# Patient Record
Sex: Female | Born: 1990 | Race: Black or African American | Hispanic: No | Marital: Single | Smoking: Current every day smoker
Health system: Southern US, Community
[De-identification: ages and names within clinical notes are randomized; demographics above are authoritative.]

## PROBLEM LIST (undated history)

## (undated) ENCOUNTER — Inpatient Hospital Stay (HOSPITAL_COMMUNITY): Payer: Self-pay

## (undated) DIAGNOSIS — D649 Anemia, unspecified: Secondary | ICD-10-CM

## (undated) DIAGNOSIS — F32A Depression, unspecified: Secondary | ICD-10-CM

## (undated) DIAGNOSIS — Z789 Other specified health status: Secondary | ICD-10-CM

## (undated) DIAGNOSIS — F329 Major depressive disorder, single episode, unspecified: Secondary | ICD-10-CM

## (undated) DIAGNOSIS — R51 Headache: Secondary | ICD-10-CM

## (undated) DIAGNOSIS — R87629 Unspecified abnormal cytological findings in specimens from vagina: Secondary | ICD-10-CM

## (undated) DIAGNOSIS — J45909 Unspecified asthma, uncomplicated: Secondary | ICD-10-CM

## (undated) DIAGNOSIS — J302 Other seasonal allergic rhinitis: Secondary | ICD-10-CM

## (undated) DIAGNOSIS — R519 Headache, unspecified: Secondary | ICD-10-CM

## (undated) DIAGNOSIS — F419 Anxiety disorder, unspecified: Secondary | ICD-10-CM

## (undated) HISTORY — PX: COLPOSCOPY: SHX161

## (undated) HISTORY — DX: Unspecified asthma, uncomplicated: J45.909

## (undated) HISTORY — PX: THERAPEUTIC ABORTION: SHX798

---

## 2015-09-21 NOTE — L&D Delivery Note (Signed)
Delivery Note At 4:49 PM a viable and healthy female was delivered via Vaginal, Spontaneous Delivery (Presentation:vtx ;LOA  ).  APGAR: 9, 9; weight 7 lb 0.2 oz (3181 g).   Placenta status: spontaneous, intact not sent , .  Cord:  with the following complications: none.  Cord pH: none  Anesthesia:  epidural Episiotomy: None Lacerations: None Suture Repair: n/a Est. Blood Loss (mL): 250  Mom to postpartum.  Baby to Couplet care / Skin to Skin.  Thresia Ramanathan A 09/04/2016, 6:30 PM

## 2015-10-29 ENCOUNTER — Ambulatory Visit (INDEPENDENT_AMBULATORY_CARE_PROVIDER_SITE_OTHER): Payer: Self-pay

## 2015-10-29 DIAGNOSIS — Z3201 Encounter for pregnancy test, result positive: Secondary | ICD-10-CM

## 2015-10-29 LAB — POCT PREGNANCY, URINE: PREG TEST UR: POSITIVE — AB

## 2015-10-29 NOTE — Progress Notes (Signed)
Pt here today for pregnancy test.  Resulted positive.  Pt decided to come to the Clinics for her prenatal care.  Because pt's BMI is >30 she will need a early one hour.  Due to time, pt decided to wait to do all lab work until her NEW OB appt.  LMP is 10/07/15 pt reports.  EDD 07/03/16.

## 2015-11-17 ENCOUNTER — Telehealth: Payer: Self-pay | Admitting: General Practice

## 2015-11-17 NOTE — Telephone Encounter (Signed)
Patient called and left message stating she was here on 2/8 and had a positive pregnancy test. Patient states she went to the ER yesterday for spotting and was told that she was only 2-[redacted] weeks pregnant and was possible having a miscarriage. Patient is calling us for advice on what she needs to do from here. Called patient and she states she has only had spotting and just sees something when she wipes. Patient states she has been cramping some. Patient states they did an ultrasound and told her they couldn't see anything and from her blood test she was only 2-[redacted] weeks pregnant. Patient states she went to [facility] ER and had sex over the weekend which is when the bleeding started. Told patient that it can be normal to spot especially after intercourse. Told patient that the blood test cannot determine how far along she is. Told patient the blood test is only helpful in observing the trend of the numbers and if they are going up or down or doubling every 48 hours like we would expect. Told patient if she isn't far enough along the ultrasound will not show a pregnancy either. Discussed with patient if she has increased bleeding or increased pain to let us know otherwise we will follow up with her at her appt per Dr Jolayne Panther. Told patient if she has any other problems or questions and our office is closed, to go to the ER here at [facility] rather than [facility]. Patient verbalized understanding to all & had no other questions

## 2015-11-17 NOTE — Telephone Encounter (Signed)
Patient called into front office stating she doesn't want to wait until the end of the month to find out if she has miscarried or not. Spoke to Dr Adrian Blackwater who recommended patient come tomorrow for bhcg and to have patient bring results from [facility] with her if not patient should return on Thursday for repeat bhcg. Discussed recommendations with patient. Patient states she can come tomorrow at 830am. Patient had no questions

## 2015-11-18 ENCOUNTER — Inpatient Hospital Stay (HOSPITAL_COMMUNITY)
Admission: AD | Admit: 2015-11-18 | Discharge: 2015-11-18 | Disposition: A | Discharge status: Home / Self Care | Payer: 59 | Source: Ambulatory Visit | Attending: Obstetrics & Gynecology | Admitting: Obstetrics & Gynecology

## 2015-11-18 ENCOUNTER — Encounter (HOSPITAL_COMMUNITY): Payer: Self-pay | Admitting: *Deleted

## 2015-11-18 ENCOUNTER — Other Ambulatory Visit: Payer: 59

## 2015-11-18 ENCOUNTER — Inpatient Hospital Stay (HOSPITAL_COMMUNITY): Payer: 59

## 2015-11-18 DIAGNOSIS — Z3A01 Less than 8 weeks gestation of pregnancy: Secondary | ICD-10-CM | POA: Diagnosis not present

## 2015-11-18 DIAGNOSIS — R109 Unspecified abdominal pain: Secondary | ICD-10-CM

## 2015-11-18 DIAGNOSIS — O26891 Other specified pregnancy related conditions, first trimester: Secondary | ICD-10-CM | POA: Insufficient documentation

## 2015-11-18 DIAGNOSIS — N939 Abnormal uterine and vaginal bleeding, unspecified: Secondary | ICD-10-CM | POA: Diagnosis present

## 2015-11-18 DIAGNOSIS — O26899 Other specified pregnancy related conditions, unspecified trimester: Secondary | ICD-10-CM

## 2015-11-18 DIAGNOSIS — O200 Threatened abortion: Secondary | ICD-10-CM | POA: Diagnosis not present

## 2015-11-18 HISTORY — DX: Other specified health status: Z78.9

## 2015-11-18 LAB — URINALYSIS, ROUTINE W REFLEX MICROSCOPIC
Bilirubin Urine: NEGATIVE
Glucose, UA: NEGATIVE mg/dL
Ketones, ur: 15 mg/dL — AB
LEUKOCYTES UA: NEGATIVE
NITRITE: NEGATIVE
PH: 6.5 (ref 5.0–8.0)
Protein, ur: NEGATIVE mg/dL
SPECIFIC GRAVITY, URINE: 1.02 (ref 1.005–1.030)

## 2015-11-18 LAB — CBC
HCT: 38.5 % (ref 36.0–46.0)
HEMOGLOBIN: 12.8 g/dL (ref 12.0–15.0)
MCH: 31.1 pg (ref 26.0–34.0)
MCHC: 33.2 g/dL (ref 30.0–36.0)
MCV: 93.4 fL (ref 78.0–100.0)
Platelets: 386 10*3/uL (ref 150–400)
RBC: 4.12 MIL/uL (ref 3.87–5.11)
RDW: 13.1 % (ref 11.5–15.5)
WBC: 6.8 10*3/uL (ref 4.0–10.5)

## 2015-11-18 LAB — URINE MICROSCOPIC-ADD ON

## 2015-11-18 LAB — HCG, QUANTITATIVE, PREGNANCY: hCG, Beta Chain, Quant, S: 457 m[IU]/mL — ABNORMAL HIGH (ref <5)

## 2015-11-18 MED ORDER — HYDROMORPHONE HCL 1 MG/ML IJ SOLN
1.0000 mg | Freq: Once | INTRAMUSCULAR | Status: AC
  Administered 2015-11-18: 1 mg via INTRAMUSCULAR
  Filled 2015-11-18: qty 1

## 2015-11-18 MED ORDER — PROMETHAZINE HCL 25 MG/ML IJ SOLN
12.5000 mg | Freq: Once | INTRAMUSCULAR | Status: AC
  Administered 2015-11-18: 12.5 mg via INTRAMUSCULAR
  Filled 2015-11-18: qty 1

## 2015-11-18 NOTE — Progress Notes (Unsigned)
Pt came to clinic to have hcg drawn. She states that she is in severe pain and is bleeding heavy like a period. Patient escorted to MAU for evaluation.

## 2015-11-18 NOTE — MAU Note (Signed)
Pt states she had intercourse Sunday morning started spotting afterwards.  Went to [facility] that day.  They did ultrasound and said she was about 2-[redacted] weeks pregnant.  Pt continues to bleed more heavy which started yesterday.  Passing dime sized clots.  Cramping like a period.  Pt had vaginal cultures [place] on Sunday.  Awaiting on STD results.

## 2015-11-18 NOTE — Discharge Instructions (Signed)

## 2015-11-18 NOTE — MAU Provider Note (Signed)
Chief Complaint: Abdominal Pain and Vaginal Bleeding   First Provider Initiated Contact with Patient 11/18/15 0932        SUBJECTIVE   Beth Carey is a 25 y.o. G4P1021 at [redacted]w[redacted]d by LMP who presents to maternity admissions reporting increased bleeding and cramping  Was seen at [facility] ED 2 days ago but decided to follow up here because she didn't like that hospital. She denies vaginal itching/burning, urinary symptoms, h/a, dizziness, n/v, or fever/chills.    Abdominal Pain This is a recurrent problem. The current episode started in the past 7 days. The onset quality is gradual. The problem occurs constantly. The problem has been unchanged. The pain is located in the LLQ, RLQ and suprapubic region. The quality of the pain is cramping. The abdominal pain does not radiate. Pertinent negatives include no constipation, diarrhea, dysuria, fever, headaches, myalgias, nausea or vomiting. Nothing aggravates the pain. The pain is relieved by nothing. She has tried nothing for the symptoms.  Vaginal Bleeding The patient's primary symptoms include pelvic pain. The patient's pertinent negatives include no vaginal discharge. Associated symptoms include abdominal pain. Pertinent negatives include no back pain, chills, constipation, diarrhea, dysuria, fever, headaches, nausea or vomiting.    Past Medical History  Diagnosis Date  . Medical history non-contributory    Past Surgical History  Procedure Laterality Date  . No past surgeries     Social History   Social History  . Marital Status: Single    Spouse Name: N/A  . Number of Children: N/A  . Years of Education: N/A   Occupational History  . Not on file.   Social History Main Topics  . Smoking status: Never Smoker   . Smokeless tobacco: Not on file  . Alcohol Use: No  . Drug Use: No  . Sexual Activity: Yes    Birth Control/ Protection: None   Other Topics Concern  . Not on file   Social History Narrative  . No narrative on file    No current facility-administered medications on file prior to encounter.   No current outpatient prescriptions on file prior to encounter.   No Known Allergies  I have reviewed patient's Past Medical Hx, Surgical Hx, Family Hx, Social Hx, medications and allergies.   ROS:  Review of Systems  Constitutional: Negative for fever, chills and fatigue.  Gastrointestinal: Positive for abdominal pain. Negative for nausea, vomiting, diarrhea and constipation.  Genitourinary: Positive for vaginal bleeding and pelvic pain. Negative for dysuria and vaginal discharge.  Musculoskeletal: Negative for myalgias and back pain.  Neurological: Negative for headaches.  Other systems negative  Physical Exam  Patient Vitals for the past 24 hrs:  BP Temp Temp src Pulse Resp SpO2  11/18/15 0937 116/67 mmHg 98.8 F (37.1 C) Oral 113 16 100 %    Physical Exam  Constitutional: Well-developed, well-nourished female in no acute distress.  Cardiovascular: normal rate Respiratory: normal effort GI: Abd soft, non-tender. Pos BS x 4    MS: Extremities nontender, no edema, normal ROM Neurologic: Alert and oriented x 4.  GU: Neg CVAT.  PELVIC EXAM: Uterus nonenlarged, <6 wks size, mildly  tender to palpation                           EGBUS WNL, pad dry.                             Scant bleeding  despite patient's report of blood "flowing" LAB RESULTS Results for orders placed or performed during the hospital encounter of 11/18/15 (from the past 24 hour(s))  Urinalysis, Routine w reflex microscopic (not at [facility])     Status: Abnormal   Collection Time: 11/18/15  9:15 AM  Result Value Ref Range   Color, Urine YELLOW YELLOW   APPearance CLEAR CLEAR   Specific Gravity, Urine 1.020 1.005 - 1.030   pH 6.5 5.0 - 8.0   Glucose, UA NEGATIVE NEGATIVE mg/dL   Hgb urine dipstick LARGE (A) NEGATIVE   Bilirubin Urine NEGATIVE NEGATIVE   Ketones, ur 15 (A) NEGATIVE mg/dL   Protein, ur NEGATIVE NEGATIVE mg/dL    Nitrite NEGATIVE NEGATIVE   Leukocytes, UA NEGATIVE NEGATIVE  Urine microscopic-add on     Status: Abnormal   Collection Time: 11/18/15  9:15 AM  Result Value Ref Range   Squamous Epithelial / LPF 0-5 (A) NONE SEEN   WBC, UA 0-5 0 - 5 WBC/hpf   RBC / HPF 6-30 0 - 5 RBC/hpf   Bacteria, UA FEW (A) NONE SEEN   Urine-Other MUCOUS PRESENT   CBC     Status: None   Collection Time: 11/18/15  9:46 AM  Result Value Ref Range   WBC 6.8 4.0 - 10.5 K/uL   RBC 4.12 3.87 - 5.11 MIL/uL   Hemoglobin 12.8 12.0 - 15.0 g/dL   HCT 16.1 09.6 - 04.5 %   MCV 93.4 78.0 - 100.0 fL   MCH 31.1 26.0 - 34.0 pg   MCHC 33.2 30.0 - 36.0 g/dL   RDW 40.9 81.1 - 91.4 %   Platelets 386 150 - 400 K/uL  hCG, quantitative, pregnancy     Status: Abnormal   Collection Time: 11/18/15  9:48 AM  Result Value Ref Range   hCG, Beta Chain, Quant, S 457 (H) <5 mIU/mL   Prior Quant HCG level was 424 on 11/16/15    IMAGING US Ob Comp Less 14 Wks  11/18/2015  CLINICAL DATA:  Two-day history of vaginal bleeding with low abdominal pain and positive pregnancy test. EXAM: OBSTETRIC <14 WK [place] AND TRANSVAGINAL OB US TECHNIQUE: Both transabdominal and transvaginal ultrasound examinations were performed for complete evaluation of the gestation as well as the maternal uterus, adnexal regions, and pelvic cul-de-sac. Transvaginal technique was performed to assess early pregnancy. COMPARISON:  11/16/2015 FINDINGS: Intrauterine gestational sac: Not visualized. Yolk sac:  Not present. Embryo:  Not present. Cardiac Activity: Not press. Subchorionic hemorrhage:  None visualized. Maternal uterus/adnexae: Endometrium is slightly thickened and heterogeneous. Maternal ovaries are unremarkable. No evidence for adnexal mass. IMPRESSION: No intrauterine gestation identified and no adnexal masses evident. Given the history of a positive pregnancy test, differential considerations include intrauterine gestation too early to visualize, completed abortion, or  nonvisualized ectopic pregnancy. Close clinical correlation is recommended with serial beta-hCG and followup ultrasound as warranted. Electronically Signed   By: Kennith Center M.D.   On: 11/18/2015 11:58   US Ob Transvaginal  11/18/2015  CLINICAL DATA:  Two-day history of vaginal bleeding with low abdominal pain and positive pregnancy test. EXAM: OBSTETRIC <14 WK [place] AND TRANSVAGINAL OB US TECHNIQUE: Both transabdominal and transvaginal ultrasound examinations were performed for complete evaluation of the gestation as well as the maternal uterus, adnexal regions, and pelvic cul-de-sac. Transvaginal technique was performed to assess early pregnancy. COMPARISON:  11/16/2015 FINDINGS: Intrauterine gestational sac: Not visualized. Yolk sac:  Not present. Embryo:  Not present. Cardiac Activity: Not press. Subchorionic hemorrhage:  None visualized. Maternal uterus/adnexae: Endometrium is slightly thickened and heterogeneous. Maternal ovaries are unremarkable. No evidence for adnexal mass. IMPRESSION: No intrauterine gestation identified and no adnexal masses evident. Given the history of a positive pregnancy test, differential considerations include intrauterine gestation too early to visualize, completed abortion, or nonvisualized ectopic pregnancy. Close clinical correlation is recommended with serial beta-hCG and followup ultrasound as warranted. Electronically Signed   By: Kennith Center M.D.   On: 11/18/2015 11:58     MAU Management/MDM: Ordered labs and reviewed results.   Consult Dr Penne Lash with presentation, exam findings, and results.  She recommends getting another [place] to rule out ectopic location of pregnancy Treatments in MAU included Dilaudid and Phenergan injection for pain and nausea.   This bleeding could represent a normal pregnancy with bleeding, spontaneous abortion or even an ectopic which can be life-threatening.   Cultures were done to rule out pelvic infection at [facility]. Blood drawn for  Quant HCG, CBC.   ABO/Rh done at [facility] showed type of O+  ASSESSMENT Pregnancy at [redacted]w[redacted]d  Pregnancy of unknown viability and location  PLAN Dr Penne Lash discussed results with patient and offered F/U Quant HCG in 2 days vs Methotrexate for possible ectopic  Patient wants to do one more HCG level Discharge home Strict ectopic precautions Pt stable at time of discharge. Appt made for Thursday in clinic at 11am for HCG     Medication List    Notice    You have not been prescribed any medications.       Encouraged to return here or to other Urgent Care/ED if she develops worsening of symptoms, increase in pain, fever, or other concerning symptoms.    Wynelle Bourgeois CNM, MSN Certified Nurse-Midwife 11/18/2015  10:16 AM

## 2015-11-20 ENCOUNTER — Encounter: Payer: Self-pay | Admitting: Advanced Practice Midwife

## 2015-11-20 ENCOUNTER — Ambulatory Visit (INDEPENDENT_AMBULATORY_CARE_PROVIDER_SITE_OTHER): Payer: 59 | Admitting: Advanced Practice Midwife

## 2015-11-20 DIAGNOSIS — O034 Incomplete spontaneous abortion without complication: Secondary | ICD-10-CM | POA: Insufficient documentation

## 2015-11-20 DIAGNOSIS — O3680X0 Pregnancy with inconclusive fetal viability, not applicable or unspecified: Secondary | ICD-10-CM

## 2015-11-20 LAB — HCG, QUANTITATIVE, PREGNANCY: hCG, Beta Chain, Quant, S: 133 m[IU]/mL — ABNORMAL HIGH (ref <5)

## 2015-11-20 NOTE — Patient Instructions (Signed)
Miscarriage  A miscarriage is the sudden loss of an unborn baby (fetus) before the 20th week of pregnancy. Most miscarriages happen in the first 3 months of pregnancy. Sometimes, it happens before a woman even knows she is pregnant. A miscarriage is also called a "spontaneous miscarriage" or "early pregnancy loss." Having a miscarriage can be an emotional experience. Talk with your caregiver about any questions you may have about miscarrying, the grieving process, and your future pregnancy plans.  CAUSES    Problems with the fetal chromosomes that make it impossible for the baby to develop normally. Problems with the baby's genes or chromosomes are most often the result of errors that occur, by chance, as the embryo divides and grows. The problems are not inherited from the parents.   Infection of the cervix or uterus.    Hormone problems.    Problems with the cervix, such as having an incompetent cervix. This is when the tissue in the cervix is not strong enough to hold the pregnancy.    Problems with the uterus, such as an abnormally shaped uterus, uterine fibroids, or congenital abnormalities.    Certain medical conditions.    Smoking, drinking alcohol, or taking illegal drugs.    Trauma.   Often, the cause of a miscarriage is unknown.   SYMPTOMS    Vaginal bleeding or spotting, with or without cramps or pain.   Pain or cramping in the abdomen or lower back.   Passing fluid, tissue, or blood clots from the vagina.  DIAGNOSIS   Your caregiver will perform a physical exam. You may also have an ultrasound to confirm the miscarriage. Blood or urine tests may also be ordered.  TREATMENT    Sometimes, treatment is not necessary if you naturally pass all the fetal tissue that was in the uterus. If some of the fetus or placenta remains in the body (incomplete miscarriage), tissue left behind may become infected and must be removed. Usually, a dilation and curettage (D and C) procedure is performed.  During a D and C procedure, the cervix is widened (dilated) and any remaining fetal or placental tissue is gently removed from the uterus.   Antibiotic medicines are prescribed if there is an infection. Other medicines may be given to reduce the size of the uterus (contract) if there is a lot of bleeding.   If you have Rh negative blood and your baby was Rh positive, you will need a Rh immunoglobulin shot. This shot will protect any future baby from having Rh blood problems in future pregnancies.  HOME CARE INSTRUCTIONS    Your caregiver may order bed rest or may allow you to continue light activity. Resume activity as directed by your caregiver.   Have someone help with home and family responsibilities during this time.    Keep track of the number of sanitary pads you use each day and how soaked (saturated) they are. Write down this information.    Do not use tampons. Do not douche or have sexual intercourse until approved by your caregiver.    Only take over-the-counter or prescription medicines for pain or discomfort as directed by your caregiver.    Do not take aspirin. Aspirin can cause bleeding.    Keep all follow-up appointments with your caregiver.    If you or your partner have problems with grieving, talk to your caregiver or seek counseling to help cope with the pregnancy loss. Allow enough time to grieve before trying to get pregnant again.     SEEK IMMEDIATE MEDICAL CARE IF:    You have severe cramps or pain in your back or abdomen.   You have a fever.   You pass large blood clots (walnut-sized or larger) ortissue from your vagina. Save any tissue for your caregiver to inspect.    Your bleeding increases.    You have a thick, bad-smelling vaginal discharge.   You become lightheaded, weak, or you faint.    You have chills.   MAKE SURE YOU:   Understand these instructions.   Will watch your condition.   Will get help right away if you are not doing well or get worse.     This  information is not intended to replace advice given to you by your health care provider. Make sure you discuss any questions you have with your health care provider.     Document Released: 03/02/2001 Document Revised: 01/01/2013 Document Reviewed: 10/26/2011  Elsevier Interactive Patient Education 2016 [employer] Inc.

## 2015-11-20 NOTE — Progress Notes (Signed)
[place] her for stat bhcg. Denies severe pain, states still having bleeding same as 2 days ago- with clots , changing pad every 2 hours.

## 2015-11-20 NOTE — Progress Notes (Signed)
Beth Carey  is a 25 y.o. [phone] at [redacted]w[redacted]d who presents to the [facility] today for follow-up quant hCG after 48 hours. The patient was seen in ED on 11/16/15 and had quant hCG of 424 and US showed nothing in uterus. She endorses mild pain, light to moderate vaginal bleeding but no fever today. She was seen by me and Dr Penne Lash 2 days ago. Her HCG was about the same as it was the first day.  She was counseled toward possibility of SAB or ectopic but wanted to do one more quant as this is a desired pregnancy.   Ref. Range 11/18/2015 09:48 11/18/2015 11:48 11/20/2015 11:25  HCG, Beta Chain, Quant, S Latest Ref Range: <5 mIU/mL 457 (H)  133 (H)   OB History  Gravida Para Term Preterm AB SAB TAB Ectopic Multiple Living  # Outcome Date GA Lbr Len/2nd Weight Sex Delivery Anes PTL Lv  4 Current           3 AB           2 AB           1 Term               Past Medical History  Diagnosis Date  . Medical history non-contributory      LMP 09/27/2015  CONSTITUTIONAL: Well-developed, well-nourished female in no acute distress.  ENT: External right and left ear normal.  EYES: EOM intact, conjunctivae normal.  MUSCULOSKELETAL: Normal range of motion.  CARDIOVASCULAR: Regular heart rate RESPIRATORY: Normal effort NEUROLOGICAL: Alert and oriented to person, place, and time.  SKIN: Skin is warm and dry. No rash noted. Not diaphoretic. No erythema. No pallor. PSYCH: Normal mood and affect. Normal behavior. Normal judgment and thought content. Pelvic exam deferred  Results for orders placed or performed in visit on 11/20/15 (from the past 24 hour(s))  B-HCG Quant     Status: Abnormal   Collection Time: 11/20/15 11:25 AM  Result Value Ref Range   hCG, Beta Chain, Quant, S 133 (H) <5 mIU/mL    A: Pregnancy at [redacted]w[redacted]d by LMP Fall in quant hCG after 48 hours  P: Discharge home Discussed SAB. Very tearful. Long discussion of coping and plan of care. Repeat HCG in  one week. No sex until she stops bleeding.  Memory heart given to patient Patient may return to MAU as needed or if her condition were to change or worsen   Aviva Signs, CNM 11/20/2015 2:30 PM

## 2015-11-27 ENCOUNTER — Other Ambulatory Visit: Payer: 59

## 2015-11-27 DIAGNOSIS — O3680X0 Pregnancy with inconclusive fetal viability, not applicable or unspecified: Secondary | ICD-10-CM

## 2015-11-27 LAB — HCG, QUANTITATIVE, PREGNANCY

## 2015-12-18 ENCOUNTER — Encounter: Payer: Self-pay | Admitting: Obstetrics and Gynecology

## 2016-01-30 ENCOUNTER — Ambulatory Visit (INDEPENDENT_AMBULATORY_CARE_PROVIDER_SITE_OTHER): Admitting: Emergency Medicine

## 2016-01-30 VITALS — BP 102/70 | HR 79 | Temp 98.5°F | Resp 16 | Ht 64.75 in | Wt 201.2 lb

## 2016-01-30 DIAGNOSIS — S7001XA Contusion of right hip, initial encounter: Secondary | ICD-10-CM | POA: Diagnosis not present

## 2016-01-30 DIAGNOSIS — S7011XA Contusion of right thigh, initial encounter: Secondary | ICD-10-CM

## 2016-01-30 DIAGNOSIS — Z3490 Encounter for supervision of normal pregnancy, unspecified, unspecified trimester: Secondary | ICD-10-CM

## 2016-01-30 DIAGNOSIS — Z331 Pregnant state, incidental: Secondary | ICD-10-CM | POA: Insufficient documentation

## 2016-01-30 NOTE — Progress Notes (Signed)
   Subjective:  This chart was scribed for Lesle ChrisSteven Neila Teem MD,  by Veverly FellsHatice Demirci,scribe, at [facility].  This patient was seen in room 13 and the patient's care was started at 12:16 PM.   Chief Complaint  Patient presents with  . Fall    fell 01/27/16 at work and landed on right thigh and leg   . OTHER    PATIENT IS PREGNANT; NO XRAYS--8 WEEKS     Patient ID: Beth Carey, female    DOB: 1991-08-17, 25 y.o.   MRN: [id number]  HPI HPI Comments: Beth Carey is a 25 y.o. female who presents to the [facility] complaining of a thigh injury which occurred when she was walking down a wood ramp and fell on her right side (hip/leg) three days ago.  She continued working that day but had not been able to go back since. 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  She is currently pregnant (8 weeks).  She denies any concerns regarding her pregnancy and is going to  [facility].  This will be her second child.  First child is 27 years old. She has no other questions or concerns today. Patient is willing to come back Wednesday for a follow up.   Review of Systems  Constitutional: Negative for fever and chills.  Eyes: Negative for pain, redness and itching.  Respiratory: Negative for cough, choking and shortness of breath.   Gastrointestinal: Negative for nausea and vomiting.  Musculoskeletal: Positive for myalgias. Negative for neck pain and neck stiffness.  Skin: Negative for rash.  Neurological: Negative for seizures, syncope and speech difficulty.       Objective:   Physical Exam Filed Vitals:   01/30/16 1110  BP: 102/70  Pulse: 79  Temp: 98.5 F (36.9 C)  TempSrc: Oral  Resp: 16  Height: 5' 4.75" (1.645 m)  Weight: 201 lb 3.2 oz (91.264 kg)  SpO2: 100%    CONSTITUTIONAL: Well developed/well nourished, pregnant HEAD: Normocephalic/atraumatic EYES: EOMI/PERRL SPINE/BACK:entire spine nontender CV:  S1/S2 noted, no murmurs/rubs/gallops noted LUNGS: Lungs are clear to auscultation bilaterally, no apparent distress NEURO: Pt is awake/alert/appropriate, moves all extremitiesx4.  No facial droop.   EXTREMITIES:Mild tenderness upper right thigh with pain on full flexion of the right leg. No bruising or swelling and good range of motion of the hip.  SKIN: warm, color normal PSYCH: no abnormalities of mood noted, alert and oriented to situation      Assessment & Plan:  Patient will be on light duty for 5 days. We'll recheck on Wednesday. She will be limited walking no kneeling  Or squatting. I personally performed the services described in this documentation, which was scribed in my presence. The recorded information has been reviewed and is accurate. Collene GobbleSteven A Misako Roeder, MD

## 2016-01-30 NOTE — Patient Instructions (Addendum)
please check with your OB/GYN regarding what medications you can take. If he gets the okay you can take 1 extra strength Tylenol every 6 hours. You can also try to apply heat to alternate with  Ice packs to the area.    IF you received an x-ray today, you will receive an invoice from [facility]. Please contact [facility] at [phone] with questions or concerns regarding your invoice.   IF you received labwork today, you will receive an invoice from [facility]. Please contact [facility] at [phone] with questions or concerns regarding your invoice.   Our billing staff will not be able to assist you with questions regarding bills from these companies.  You will be contacted with the lab results as soon as they are available. The fastest way to get your results is to activate your My Chart account. Instructions are located on the last page of this paperwork. If you have not heard from us regarding the results in 2 weeks, please contact this office.

## 2016-02-05 ENCOUNTER — Ambulatory Visit (INDEPENDENT_AMBULATORY_CARE_PROVIDER_SITE_OTHER): Admitting: Family Medicine

## 2016-02-05 VITALS — BP 102/68 | HR 83 | Temp 98.5°F | Resp 16 | Ht 64.0 in | Wt 201.8 lb

## 2016-02-05 DIAGNOSIS — S7001XD Contusion of right hip, subsequent encounter: Secondary | ICD-10-CM | POA: Diagnosis not present

## 2016-02-05 NOTE — Progress Notes (Signed)
[age] yo woman who injured her right thigh when she fell on right side while walking down a wooden ramp.  Date of injury:  01/27/16 Initial evaluation:  01/30/16  Patient is not having any residual symptoms at this point. She's been walking without difficulty.  Objective:BP 102/68 mmHg  Pulse 83  Temp(Src) 98.5 F (36.9 C) (Oral)  Resp 16  Ht 5\' 4"  (1.626 m)  Wt 201 lb 12.8 oz (91.536 kg)  BMI 34.62 kg/m2  SpO2 99%  LMP 09/27/2015 Hip range of motion is normal on the right. Gait: Normal Palpation of the hip: No pain Inspection of the hip area: No ecchymosis or swelling  Assessment: Resolved contusion of the right hip  Signed, Sheila OatsKurt Leslea Vowles M.D.

## 2016-02-11 LAB — OB RESULTS CONSOLE ABO/RH: RH TYPE: POSITIVE

## 2016-02-11 LAB — OB RESULTS CONSOLE RPR: RPR: NONREACTIVE

## 2016-02-11 LAB — OB RESULTS CONSOLE GC/CHLAMYDIA
CHLAMYDIA, DNA PROBE: NEGATIVE
GC PROBE AMP, GENITAL: NEGATIVE

## 2016-02-11 LAB — OB RESULTS CONSOLE HIV ANTIBODY (ROUTINE TESTING): HIV: NONREACTIVE

## 2016-02-11 LAB — OB RESULTS CONSOLE RUBELLA ANTIBODY, IGM: Rubella: IMMUNE

## 2016-02-11 LAB — OB RESULTS CONSOLE ANTIBODY SCREEN: Antibody Screen: NEGATIVE

## 2016-02-11 LAB — OB RESULTS CONSOLE HEPATITIS B SURFACE ANTIGEN: HEP B S AG: NEGATIVE

## 2016-03-26 ENCOUNTER — Ambulatory Visit (INDEPENDENT_AMBULATORY_CARE_PROVIDER_SITE_OTHER): Admitting: Physician Assistant

## 2016-03-26 VITALS — BP 100/70 | HR 98 | Temp 98.8°F | Resp 16 | Ht 65.5 in | Wt 208.8 lb

## 2016-03-26 DIAGNOSIS — W540XXA Bitten by dog, initial encounter: Secondary | ICD-10-CM

## 2016-03-26 DIAGNOSIS — S71131A Puncture wound without foreign body, right thigh, initial encounter: Secondary | ICD-10-CM

## 2016-03-26 MED ORDER — MUPIROCIN 2 % EX OINT: 1.0000 "application " | TOPICAL_OINTMENT | Freq: Three times a day (TID) | CUTANEOUS | Status: DC

## 2016-03-26 NOTE — Patient Instructions (Signed)
WOUND CARE . Clean the wound twice daily for three days and reapply the The ointment and bandaids. . Continue daily cleansing with soap and water until stitches/staples are removed. . Do not apply any ointments or creams to the wound while stitches/staples are in place, as this may cause delayed healing. . Notify the office if you experience any of the following signs of infection: Swelling, redness, pus drainage, streaking, fever >101.0 F . Notify the office if you experience excessive bleeding that does not stop after 15-20 minutes of constant, firm pressure.

## 2016-03-27 NOTE — Progress Notes (Signed)
   03/28/2016 7:53 PM   DOB: 1991-02-16 / MRN: [id number]  SUBJECTIVE:  Beth Carey is a 25 y.o. female presenting for a dog bite sustained while at work.  Reports the dog was "in someone's yard" and was wearing a collar.  She reports she is [redacted] weeks pregnant. She denies weakness, paresthesia.  Denies exquisite tenderness at the site of the wound.      [facility] 03/26/2016 02/05/2016 01/30/2016  Decreased Interest 1 0 0  Down, Depressed, Hopeless 1 0 0  PHQ - 2 Score 2 0 0  Altered sleeping 1 - -  Tired, decreased energy 1 - -  Change in appetite 0 - -  Feeling bad or failure about yourself  0 - -  Trouble concentrating 0 - -  Moving slowly or fidgety/restless 0 - -  Suicidal thoughts 0 - -  PHQ-9 Score 4 - -  Difficult doing work/chores Somewhat difficult - -      She has no known allergies.   Review of Systems  Constitutional: Negative for fever and chills.  Cardiovascular: Negative for chest pain.  Gastrointestinal: Negative for nausea.  Skin: Negative for itching and rash.  Neurological: Negative for headaches.    Problem list and medications reviewed and updated by myself where necessary, and exist elsewhere in the encounter.   OBJECTIVE:  BP 100/70 mmHg  Pulse 98  Temp(Src) 98.8 F (37.1 C) (Oral)  Resp 16  Ht 5' 5.5" (1.664 m)  Wt 208 lb 12.8 oz (94.711 kg)  BMI 34.21 kg/m2  SpO2 98%  Physical Exam  Constitutional: She is oriented to person, place, and time. She appears well-nourished. No distress.  Eyes: EOM are normal. Pupils are equal, round, and reactive to light.  Cardiovascular: Normal rate.   Pulmonary/Chest: Effort normal.  Abdominal: She exhibits no distension.  Neurological: She is alert and oriented to person, place, and time. No cranial nerve deficit. Gait normal.  Skin: Skin is dry. She is not diaphoretic.  Psychiatric: She has a normal mood and affect.  Vitals reviewed.       No results found.  ASSESSMENT AND  PLAN  Beth Carey was seen today for dog bite, pregnant and depression scale during triage.  Diagnoses and all orders for this visit:  Dog bite  Puncture wound of thigh, right, initial encounter: Her wound is shallow and she was bitten by a house dog.  Advised mupirocin.  If worse tomorrow call or RTC so I can prescribe keflex.  Back to work on in 2 days if all goes well.   -     mupirocin ointment (BACTROBAN) 2 %; Apply 1 application topically 3 (three) times daily.    The patient was advised to call or return to clinic if she does not see an improvement in symptoms, or to seek the care of the closest emergency department if she worsens with the above plan.   Deliah BostonMichael Clark, MHS, PA-C [facility] 03/28/2016 7:53 PM

## 2016-07-28 ENCOUNTER — Encounter (HOSPITAL_COMMUNITY): Payer: Self-pay | Admitting: *Deleted

## 2016-07-28 ENCOUNTER — Inpatient Hospital Stay (HOSPITAL_COMMUNITY): Payer: 59

## 2016-07-28 ENCOUNTER — Inpatient Hospital Stay (HOSPITAL_COMMUNITY)
Admission: AD | Admit: 2016-07-28 | Discharge: 2016-07-28 | Disposition: A | Discharge status: Home / Self Care | Payer: 59 | Source: Ambulatory Visit | Attending: Obstetrics and Gynecology | Admitting: Obstetrics and Gynecology

## 2016-07-28 ENCOUNTER — Other Ambulatory Visit: Payer: Self-pay | Admitting: Obstetrics and Gynecology

## 2016-07-28 DIAGNOSIS — O99213 Obesity complicating pregnancy, third trimester: Secondary | ICD-10-CM | POA: Insufficient documentation

## 2016-07-28 DIAGNOSIS — O99613 Diseases of the digestive system complicating pregnancy, third trimester: Secondary | ICD-10-CM | POA: Insufficient documentation

## 2016-07-28 DIAGNOSIS — K219 Gastro-esophageal reflux disease without esophagitis: Secondary | ICD-10-CM | POA: Insufficient documentation

## 2016-07-28 DIAGNOSIS — O26843 Uterine size-date discrepancy, third trimester: Secondary | ICD-10-CM | POA: Insufficient documentation

## 2016-07-28 DIAGNOSIS — Z3A34 34 weeks gestation of pregnancy: Secondary | ICD-10-CM | POA: Insufficient documentation

## 2016-07-28 DIAGNOSIS — O99513 Diseases of the respiratory system complicating pregnancy, third trimester: Secondary | ICD-10-CM | POA: Insufficient documentation

## 2016-07-28 DIAGNOSIS — Z363 Encounter for antenatal screening for malformations: Secondary | ICD-10-CM | POA: Insufficient documentation

## 2016-07-28 DIAGNOSIS — O98513 Other viral diseases complicating pregnancy, third trimester: Secondary | ICD-10-CM | POA: Insufficient documentation

## 2016-07-28 DIAGNOSIS — A084 Viral intestinal infection, unspecified: Secondary | ICD-10-CM | POA: Diagnosis not present

## 2016-07-28 DIAGNOSIS — O034 Incomplete spontaneous abortion without complication: Secondary | ICD-10-CM

## 2016-07-28 HISTORY — DX: Depression, unspecified: F32.A

## 2016-07-28 HISTORY — DX: Major depressive disorder, single episode, unspecified: F32.9

## 2016-07-28 HISTORY — DX: Unspecified abnormal cytological findings in specimens from vagina: R87.629

## 2016-07-28 LAB — CBC
HEMATOCRIT: 35.9 % — AB (ref 36.0–46.0)
HEMOGLOBIN: 12.4 g/dL (ref 12.0–15.0)
MCH: 31.2 pg (ref 26.0–34.0)
MCHC: 34.5 g/dL (ref 30.0–36.0)
MCV: 90.2 fL (ref 78.0–100.0)
Platelets: 439 10*3/uL — ABNORMAL HIGH (ref 150–400)
RBC: 3.98 MIL/uL (ref 3.87–5.11)
RDW: 13.4 % (ref 11.5–15.5)
WBC: 8.6 10*3/uL (ref 4.0–10.5)

## 2016-07-28 LAB — COMPREHENSIVE METABOLIC PANEL
ALT: 11 U/L — ABNORMAL LOW (ref 14–54)
AST: 15 U/L (ref 15–41)
Albumin: 3.2 g/dL — ABNORMAL LOW (ref 3.5–5.0)
Alkaline Phosphatase: 96 U/L (ref 38–126)
Anion gap: 9 (ref 5–15)
BUN: 6 mg/dL (ref 6–20)
CO2: 22 mmol/L (ref 22–32)
Calcium: 8.8 mg/dL — ABNORMAL LOW (ref 8.9–10.3)
Chloride: 105 mmol/L (ref 101–111)
Creatinine, Ser: 0.58 mg/dL (ref 0.44–1.00)
GFR calc Af Amer: >60 mL/min (ref >60)
GFR calc non Af Amer: >60 mL/min (ref >60)
Glucose, Bld: 77 mg/dL (ref 65–99)
Potassium: 3.6 mmol/L (ref 3.5–5.1)
Sodium: 136 mmol/L (ref 135–145)
Total Bilirubin: 1.1 mg/dL (ref 0.3–1.2)
Total Protein: 7.2 g/dL (ref 6.5–8.1)

## 2016-07-28 MED ORDER — GI COCKTAIL ~~LOC~~
30.0000 mL | Freq: Once | ORAL | Status: AC
  Administered 2016-07-28: 30 mL via ORAL
  Filled 2016-07-28: qty 30

## 2016-07-28 MED ORDER — LACTATED RINGERS IV BOLUS (SEPSIS)
2000.0000 mL | Freq: Once | INTRAVENOUS | Status: AC
  Administered 2016-07-28 (×2): 1000 mL via INTRAVENOUS

## 2016-07-28 MED ORDER — PANTOPRAZOLE SODIUM 40 MG PO TBEC
40.0000 mg | DELAYED_RELEASE_TABLET | Freq: Once | ORAL | Status: AC
  Administered 2016-07-28: 40 mg via ORAL
  Filled 2016-07-28: qty 1

## 2016-07-28 MED ORDER — SODIUM CHLORIDE 0.9 % IV SOLN
8.0000 mg | Freq: Once | INTRAVENOUS | Status: AC
  Administered 2016-07-28: 8 mg via INTRAVENOUS
  Filled 2016-07-28: qty 4

## 2016-07-28 NOTE — MAU Note (Signed)
Pt called, is in lab.

## 2016-07-28 NOTE — MAU Provider Note (Signed)
History     Cc: nausea, vomiting, epigastric pain HPI: 25 yo G5P1031 BF @  34 1/[redacted] weeks gestation sent from the office for IVF and mgmt of GERD. Pt had been on unable to keep anything down. Pt has had only 6lb weight gain from the pregnancy. Pt had urine done in the office which showed ketones 40. Pt had some abdominal cramping and NST was done which did not show any ctx and cervix was long/closed. S<D by exam there as well with hx SGA baby with prior SVD  OB History    Gravida Para Term Preterm AB Living   5 1 1   3 1    SAB TAB Ectopic Multiple Live Births   1 2     1       Past Medical History:  Diagnosis Date  . Asthma   . Medical history non-contributory     Past Surgical History:  Procedure Laterality Date  . NO PAST SURGERIES    dilation and evacuation  Family History  Problem Relation Age of Onset  . Heart disease Mother   . Heart disease Maternal Grandmother   . Hyperlipidemia Maternal Grandmother   . Hypertension Maternal Grandmother   . Heart disease Maternal Grandfather   . Mental illness Paternal Grandmother     Social History  Substance Use Topics  . Smoking status: Never Smoker  . Smokeless tobacco: Not on file  . Alcohol use No    Allergies: No Known Allergies  Prescriptions Prior to Admission  Medication Sig Dispense Refill Last Dose  . Doxylamine-Pyridoxine (DICLEGIS PO) Take by mouth. Reported on 03/26/2016   Not Taking  . mupirocin ointment (BACTROBAN) 2 % Apply 1 application topically 3 (three) times daily. 22 g 1   . Prenatal Vit-Fe Fumarate-FA (PRENATAL VITAMIN PO) Take by mouth.   Taking     Physical Exam   BP 103/56 (BP Location: Right Arm)   Pulse 80   Temp 98.9 F (37.2 C)   Resp 16    No exam performed today, done in office. Tracing: baseline 145 (+) accel to 160 No ctx  IMP: viral gastroenteritis GERD S<D IUP@ 34 1/7 weeks P) 2l IVF. Cbc, CMP. Sonogram . Gi cocktail, zofran IV. Adat. NST ED Course   MDM Addendum: CBC  Latest Ref Rng & Units 07/28/2016 11/18/2015  WBC 4.0 - 10.5 K/uL 8.6 6.8  Hemoglobin 12.0 - 15.0 g/dL 16.112.4 09.612.8  Hematocrit 04.536.0 - 46.0 % 35.9(L) 38.5  Platelets 150 - 400 K/uL 439(H) 386   CMP Latest Ref Rng & Units 07/28/2016  Glucose 65 - 99 mg/dL 77  BUN 6 - 20 mg/dL 6  Creatinine 4.090.44 - 8.111.00 mg/dL 9.140.58  Sodium 782135 - 956145 mmol/L 136  Potassium 3.5 - 5.1 mmol/L 3.6  Chloride 101 - 111 mmol/L 105  CO2 22 - 32 mmol/L 22  Calcium 8.9 - 10.3 mg/dL 2.1(H8.8(L)  Total Protein 6.5 - 8.1 g/dL 7.2  Total Bilirubin 0.3 - 1.2 mg/dL 1.1  Alkaline Phos 38 - 126 U/L 96  AST 15 - 41 U/L 15  ALT 14 - 54 U/L 11(L)   sono done:  4lb 11 oz( 41%) nl fluid  Responded to GI cocktail. Tolerated reg diet. protonix given and to be continued  P) d/c home. Cont protonix.( pt has med). Keep sched OB appt. freq small protein intake Dimples Probus A, MD 1:04 PM 07/28/2016

## 2016-07-28 NOTE — MAU Note (Addendum)
Sent over from dr's office. Worsening of n/v.  Diarrhea started on Sunday. (really soft, not watery) No one else at home is sick.  Cramping that feels like period is going to start.  Was prescribed several meds for nausea, didn't work, hasn't taken them since August.  Was prescribed med for heartburn, never took it.

## 2016-07-28 NOTE — Discharge Instructions (Signed)
Take protonix as instructed previously. keep appt for next week  Third Trimester of Pregnancy The third trimester is from week 29 through week 42, months 7 through 9. The third trimester is a time when the fetus is growing rapidly. At the end of the ninth month, the fetus is about 20 inches in length and weighs 6-10 pounds.  BODY CHANGES Your body goes through many changes during pregnancy. The changes vary from woman to woman.   Your weight will continue to increase. You can expect to gain 25-35 pounds (11-16 kg) by the end of the pregnancy.  You may begin to get stretch marks on your hips, abdomen, and breasts.  You may urinate more often because the fetus is moving lower into your pelvis and pressing on your bladder.  You may develop or continue to have heartburn as a result of your pregnancy.  You may develop constipation because certain hormones are causing the muscles that push waste through your intestines to slow down.  You may develop hemorrhoids or swollen, bulging veins (varicose veins).  You may have pelvic pain because of the weight gain and pregnancy hormones relaxing your joints between the bones in your pelvis. Backaches may result from overexertion of the muscles supporting your posture.  You may have changes in your hair. These can include thickening of your hair, rapid growth, and changes in texture. Some women also have hair loss during or after pregnancy, or hair that feels dry or thin. Your hair will most likely return to normal after your baby is born.  Your breasts will continue to grow and be tender. A yellow discharge may leak from your breasts called colostrum.  Your belly button may stick out.  You may feel short of breath because of your expanding uterus.  You may notice the fetus "dropping," or moving lower in your abdomen.  You may have a bloody mucus discharge. This usually occurs a few days to a week before labor begins.  Your cervix becomes thin and  soft (effaced) near your due date. WHAT TO EXPECT AT YOUR PRENATAL EXAMS  You will have prenatal exams every 2 weeks until week 36. Then, you will have weekly prenatal exams. During a routine prenatal visit:  You will be weighed to make sure you and the fetus are growing normally.  Your blood pressure is taken.  Your abdomen will be measured to track your baby's growth.  The fetal heartbeat will be listened to.  Any test results from the previous visit will be discussed.  You may have a cervical check near your due date to see if you have effaced. At around 36 weeks, your caregiver will check your cervix. At the same time, your caregiver will also perform a test on the secretions of the vaginal tissue. This test is to determine if a type of bacteria, Group B streptococcus, is present. Your caregiver will explain this further. Your caregiver may ask you:  What your birth plan is.  How you are feeling.  If you are feeling the baby move.  If you have had any abnormal symptoms, such as leaking fluid, bleeding, severe headaches, or abdominal cramping.  If you are using any tobacco products, including cigarettes, chewing tobacco, and electronic cigarettes.  If you have any questions. Other tests or screenings that may be performed during your third trimester include:  Blood tests that check for low iron levels (anemia).  Fetal testing to check the health, activity level, and growth of the fetus.  Testing is done if you have certain medical conditions or if there are problems during the pregnancy.  HIV (human immunodeficiency virus) testing. If you are at high risk, you may be screened for HIV during your third trimester of pregnancy. FALSE LABOR You may feel small, irregular contractions that eventually go away. These are called Braxton Hicks contractions, or false labor. Contractions may last for hours, days, or even weeks before true labor sets in. If contractions come at regular  intervals, intensify, or become painful, it is best to be seen by your caregiver.  SIGNS OF LABOR   Menstrual-like cramps.  Contractions that are 5 minutes apart or less.  Contractions that start on the top of the uterus and spread down to the lower abdomen and back.  A sense of increased pelvic pressure or back pain.  A watery or bloody mucus discharge that comes from the vagina. If you have any of these signs before the 37th week of pregnancy, call your caregiver right away. You need to go to the hospital to get checked immediately. HOME CARE INSTRUCTIONS   Avoid all smoking, herbs, alcohol, and unprescribed drugs. These chemicals affect the formation and growth of the baby.  Do not use any tobacco products, including cigarettes, chewing tobacco, and electronic cigarettes. If you need help quitting, ask your health care provider. You may receive counseling support and other resources to help you quit.  Follow your caregiver's instructions regarding medicine use. There are medicines that are either safe or unsafe to take during pregnancy.  Exercise only as directed by your caregiver. Experiencing uterine cramps is a good sign to stop exercising.  Continue to eat regular, healthy meals.  Wear a good support bra for breast tenderness.  Do not use hot tubs, steam rooms, or saunas.  Wear your seat belt at all times when driving.  Avoid raw meat, uncooked cheese, cat litter boxes, and soil used by cats. These carry germs that can cause birth defects in the baby.  Take your prenatal vitamins.  Take 1500-2000 mg of calcium daily starting at the 20th week of pregnancy until you deliver your baby.  Try taking a stool softener (if your caregiver approves) if you develop constipation. Eat more high-fiber foods, such as fresh vegetables or fruit and whole grains. Drink plenty of fluids to keep your urine clear or pale yellow.  Take warm sitz baths to soothe any pain or discomfort caused  by hemorrhoids. Use hemorrhoid cream if your caregiver approves.  If you develop varicose veins, wear support hose. Elevate your feet for 15 minutes, 3-4 times a day. Limit salt in your diet.  Avoid heavy lifting, wear low heal shoes, and practice good posture.  Rest a lot with your legs elevated if you have leg cramps or low back pain.  Visit your dentist if you have not gone during your pregnancy. Use a soft toothbrush to brush your teeth and be gentle when you floss.  A sexual relationship may be continued unless your caregiver directs you otherwise.  Do not travel far distances unless it is absolutely necessary and only with the approval of your caregiver.  Take prenatal classes to understand, practice, and ask questions about the labor and delivery.  Make a trial run to the hospital.  Pack your hospital bag.  Prepare the baby's nursery.  Continue to go to all your prenatal visits as directed by your caregiver. SEEK MEDICAL CARE IF:  You are unsure if you are in labor or if  your water has broken.  You have dizziness.  You have mild pelvic cramps, pelvic pressure, or nagging pain in your abdominal area.  You have persistent nausea, vomiting, or diarrhea.  You have a bad smelling vaginal discharge.  You have pain with urination. SEEK IMMEDIATE MEDICAL CARE IF:   You have a fever.  You are leaking fluid from your vagina.  You have spotting or bleeding from your vagina.  You have severe abdominal cramping or pain.  You have rapid weight loss or gain.  You have shortness of breath with chest pain.  You notice sudden or extreme swelling of your face, hands, ankles, feet, or legs.  You have not felt your baby move in over an hour.  You have severe headaches that do not go away with medicine.  You have vision changes.   This information is not intended to replace advice given to you by your health care provider. Make sure you discuss any questions you have with  your health care provider.   Document Released: 08/31/2001 Document Revised: 09/27/2014 Document Reviewed: 11/07/2012 Elsevier Interactive Patient Education 2016 [employer] Food Choices for Gastroesophageal Reflux Disease, Adult When you have gastroesophageal reflux disease (GERD), the foods you eat and your eating habits are very important. Choosing the right foods can help ease the discomfort of GERD. WHAT GENERAL GUIDELINES DO I NEED TO FOLLOW?  Choose fruits, vegetables, whole grains, low-fat dairy products, and low-fat meat, fish, and poultry.  Limit fats such as oils, salad dressings, butter, nuts, and avocado.  Keep a food diary to identify foods that cause symptoms.  Avoid foods that cause reflux. These may be different for different people.  Eat frequent small meals instead of three large meals each day.  Eat your meals slowly, in a relaxed setting.  Limit fried foods.  Cook foods using methods other than frying.  Avoid drinking alcohol.  Avoid drinking large amounts of liquids with your meals.  Avoid bending over or lying down until 2-3 hours after eating. WHAT FOODS ARE NOT RECOMMENDED? The following are some foods and drinks that may worsen your symptoms: Vegetables Tomatoes. Tomato juice. Tomato and spaghetti sauce. Chili peppers. Onion and garlic. Horseradish. Fruits Oranges, grapefruit, and lemon (fruit and juice). Meats High-fat meats, fish, and poultry. This includes hot dogs, ribs, ham, sausage, salami, and bacon. Dairy Whole milk and chocolate milk. Sour cream. Cream. Butter. Ice cream. Cream cheese.  Beverages Coffee and tea, with or without caffeine. Carbonated beverages or energy drinks. Condiments Hot sauce. Barbecue sauce.  Sweets/Desserts Chocolate and cocoa. Donuts. Peppermint and spearmint. Fats and Oils High-fat foods, including [place] fries and potato chips. Other Vinegar. Strong spices, such as black pepper, white pepper, red pepper,  cayenne, curry powder, cloves, ginger, and chili powder. The items listed above may not be a complete list of foods and beverages to avoid. Contact your dietitian for more information.   This information is not intended to replace advice given to you by your health care provider. Make sure you discuss any questions you have with your health care provider.   Document Released: 09/06/2005 Document Revised: 09/27/2014 Document Reviewed: 07/11/2013 Elsevier Interactive Patient Education 2016 [employer] Inc[employer] Heartburn During Pregnancy Heartburn is a burning sensation in the chest caused by stomach acid backing up into the esophagus. Heartburn is common in pregnancy because a certain hormone (progesterone) is released when a woman is pregnant. The progesterone hormone may relax the valve that separates the esophagus from the stomach. This allows acid  to go up into the esophagus, causing heartburn. Heartburn may also happen in pregnancy because the enlarging uterus pushes up on the stomach, which pushes more acid into the esophagus. This is especially true in the later stages of pregnancy. Heartburn problems usually go away after giving birth. CAUSES  Heartburn is caused by stomach acid backing up into the esophagus. During pregnancy, this may result from various things, including:   The progesterone hormone.  Changing hormone levels.  The growing uterus pushing stomach acid upward.  Large meals.  Certain foods and drinks.  Exercise.  Increased acid production. SIGNS AND SYMPTOMS   Burning pain in the chest or lower throat.  Bitter taste in the mouth.  Coughing. DIAGNOSIS  Your health care provider will typically diagnose heartburn by taking a careful history of your concern. Blood tests may be done to check for a certain type of bacteria that is associated with heartburn. Sometimes, heartburn is diagnosed by prescribing a heartburn medicine to see if the symptoms improve. In some cases, a  procedure called an endoscopy may be done. In this procedure, a tube with a light and a camera on the end (endoscope) is used to examine the esophagus and the stomach. TREATMENT  Treatment will vary depending on the severity of your symptoms. Your health care provider may recommend:  Over-the-counter medicines (antacids, acid reducers) for mild heartburn.  Prescription medicines to decrease stomach acid or to protect your stomach lining.  Certain changes in your diet.  Elevating the head of your bed by putting blocks under the legs. This helps prevent stomach acid from backing up into the esophagus when you are lying down. HOME CARE INSTRUCTIONS   Only take over-the-counter or prescription medicines as directed by your health care provider.  Raise the head of your bed by putting blocks under the legs if instructed to do so by your health care provider. Sleeping with more pillows is not effective because it only changes the position of your head.  Do not exercise right after eating.  Avoid eating 2-3 hours before bed. Do not lie down right after eating.  Eat small meals throughout the day instead of three large meals.  Identify foods and beverages that make your symptoms worse and avoid them. Foods you may want to avoid include:  Peppers.  Chocolate.  High-fat foods, including fried foods.  Spicy foods.  Garlic and onions.  Citrus fruits, including oranges, grapefruit, lemons, and limes.  Food containing tomatoes or tomato products.  Mint.  Carbonated and caffeinated drinks.  Vinegar. SEEK MEDICAL CARE IF:  You have abdominal pain of any kind.  You feel burning in your upper abdomen or chest, especially after eating or lying down.  You have nausea and vomiting.  Your stomach feels upset after you eat. SEEK IMMEDIATE MEDICAL CARE IF:   You have severe chest pain that goes down your arm or into your jaw or neck.  You feel sweaty, dizzy, or light-headed.  You  become short of breath.  You vomit blood.  You have difficulty or pain with swallowing.  You have bloody or black, tarry stools.  You have episodes of heartburn more than 3 times a week, for more than 2 weeks. MAKE SURE YOU:  Understand these instructions.  Will watch your condition.  Will get help right away if you are not doing well or get worse.   This information is not intended to replace advice given to you by your health care provider. Make sure you  discuss any questions you have with your health care provider.   Document Released: 09/03/2000 Document Revised: 09/27/2014 Document Reviewed: 04/25/2013 Elsevier Interactive Patient Education [employer]

## 2016-08-06 LAB — OB RESULTS CONSOLE GBS: STREP GROUP B AG: POSITIVE

## 2016-08-25 ENCOUNTER — Other Ambulatory Visit: Payer: Self-pay | Admitting: Obstetrics and Gynecology

## 2016-08-31 ENCOUNTER — Encounter (HOSPITAL_COMMUNITY): Payer: Self-pay | Admitting: *Deleted

## 2016-08-31 ENCOUNTER — Telehealth (HOSPITAL_COMMUNITY): Payer: Self-pay | Admitting: *Deleted

## 2016-08-31 NOTE — Telephone Encounter (Signed)
Preadmission screen  

## 2016-09-04 ENCOUNTER — Inpatient Hospital Stay (HOSPITAL_COMMUNITY): Payer: 59 | Admitting: Anesthesiology

## 2016-09-04 ENCOUNTER — Inpatient Hospital Stay (HOSPITAL_COMMUNITY)
Admission: RE | Admit: 2016-09-04 | Discharge: 2016-09-06 | DRG: 775 | Disposition: A | Discharge status: Home / Self Care | Payer: 59 | Source: Ambulatory Visit | Attending: Obstetrics and Gynecology | Admitting: Obstetrics and Gynecology

## 2016-09-04 ENCOUNTER — Encounter (HOSPITAL_COMMUNITY): Payer: Self-pay

## 2016-09-04 DIAGNOSIS — K219 Gastro-esophageal reflux disease without esophagitis: Secondary | ICD-10-CM | POA: Diagnosis present

## 2016-09-04 DIAGNOSIS — O99824 Streptococcus B carrier state complicating childbirth: Secondary | ICD-10-CM | POA: Diagnosis present

## 2016-09-04 DIAGNOSIS — O9962 Diseases of the digestive system complicating childbirth: Secondary | ICD-10-CM | POA: Diagnosis present

## 2016-09-04 DIAGNOSIS — Z8249 Family history of ischemic heart disease and other diseases of the circulatory system: Secondary | ICD-10-CM

## 2016-09-04 DIAGNOSIS — O2243 Hemorrhoids in pregnancy, third trimester: Secondary | ICD-10-CM | POA: Diagnosis present

## 2016-09-04 DIAGNOSIS — Z3490 Encounter for supervision of normal pregnancy, unspecified, unspecified trimester: Secondary | ICD-10-CM

## 2016-09-04 DIAGNOSIS — Z3493 Encounter for supervision of normal pregnancy, unspecified, third trimester: Secondary | ICD-10-CM | POA: Diagnosis present

## 2016-09-04 DIAGNOSIS — Z3A39 39 weeks gestation of pregnancy: Secondary | ICD-10-CM

## 2016-09-04 LAB — CBC
HEMATOCRIT: 34.4 % — AB (ref 36.0–46.0)
HEMOGLOBIN: 11.9 g/dL — AB (ref 12.0–15.0)
MCH: 30.6 pg (ref 26.0–34.0)
MCHC: 34.6 g/dL (ref 30.0–36.0)
MCV: 88.4 fL (ref 78.0–100.0)
Platelets: 408 10*3/uL — ABNORMAL HIGH (ref 150–400)
RBC: 3.89 MIL/uL (ref 3.87–5.11)
RDW: 14.2 % (ref 11.5–15.5)
WBC: 7.7 10*3/uL (ref 4.0–10.5)

## 2016-09-04 LAB — TYPE AND SCREEN
ABO/RH(D): O POS
ANTIBODY SCREEN: NEGATIVE

## 2016-09-04 LAB — ABO/RH: ABO/RH(D): O POS

## 2016-09-04 MED ORDER — IBUPROFEN 600 MG PO TABS
600.0000 mg | ORAL_TABLET | Freq: Four times a day (QID) | ORAL | Status: DC
  Administered 2016-09-04 – 2016-09-06 (×7): 600 mg via ORAL
  Filled 2016-09-04 (×7): qty 1

## 2016-09-04 MED ORDER — PENICILLIN G POTASSIUM 5000000 UNITS IJ SOLR
5.0000 10*6.[IU] | Freq: Once | INTRAVENOUS | Status: AC
  Administered 2016-09-04: 5 10*6.[IU] via INTRAVENOUS
  Filled 2016-09-04: qty 5

## 2016-09-04 MED ORDER — FENTANYL 2.5 MCG/ML BUPIVACAINE 1/10 % EPIDURAL INFUSION (WH - ANES)
14.0000 mL/h | INTRAMUSCULAR | Status: DC | PRN
  Administered 2016-09-04: 14 mL/h via EPIDURAL
  Filled 2016-09-04: qty 100

## 2016-09-04 MED ORDER — OXYTOCIN 40 UNITS IN LACTATED RINGERS INFUSION - SIMPLE MED
2.5000 [IU]/h | INTRAVENOUS | Status: DC
  Filled 2016-09-04: qty 1000

## 2016-09-04 MED ORDER — BENZOCAINE-MENTHOL 20-0.5 % EX AERO
1.0000 "application " | INHALATION_SPRAY | CUTANEOUS | Status: DC | PRN
  Administered 2016-09-05: 1 via TOPICAL
  Filled 2016-09-04: qty 56

## 2016-09-04 MED ORDER — COCONUT OIL OIL: 1.0000 "application " | TOPICAL_OIL | Status: DC | PRN

## 2016-09-04 MED ORDER — SENNOSIDES-DOCUSATE SODIUM 8.6-50 MG PO TABS
2.0000 | ORAL_TABLET | ORAL | Status: DC
  Administered 2016-09-05 (×2): 2 via ORAL
  Filled 2016-09-04 (×2): qty 2

## 2016-09-04 MED ORDER — ONDANSETRON HCL 4 MG/2ML IJ SOLN: 4.0000 mg | Freq: Four times a day (QID) | INTRAMUSCULAR | Status: DC | PRN

## 2016-09-04 MED ORDER — PANTOPRAZOLE SODIUM 40 MG PO TBEC
40.0000 mg | DELAYED_RELEASE_TABLET | Freq: Two times a day (BID) | ORAL | Status: DC
  Administered 2016-09-05 (×2): 40 mg via ORAL
  Filled 2016-09-04 (×3): qty 1

## 2016-09-04 MED ORDER — LACTATED RINGERS IV SOLN: 500.0000 mL | Freq: Once | INTRAVENOUS | Status: DC

## 2016-09-04 MED ORDER — SOD CITRATE-CITRIC ACID 500-334 MG/5ML PO SOLN
30.0000 mL | ORAL | Status: DC | PRN
Start: 2016-09-04 — End: 2016-09-04

## 2016-09-04 MED ORDER — DIPHENHYDRAMINE HCL 50 MG/ML IJ SOLN
12.5000 mg | INTRAMUSCULAR | Status: DC | PRN
Start: 2016-09-04 — End: 2016-09-04

## 2016-09-04 MED ORDER — OXYCODONE-ACETAMINOPHEN 5-325 MG PO TABS: 2.0000 | ORAL_TABLET | ORAL | Status: DC | PRN

## 2016-09-04 MED ORDER — ZOLPIDEM TARTRATE 5 MG PO TABS: 5.0000 mg | ORAL_TABLET | Freq: Every evening | ORAL | Status: DC | PRN

## 2016-09-04 MED ORDER — ACETAMINOPHEN 325 MG PO TABS
650.0000 mg | ORAL_TABLET | ORAL | Status: DC | PRN
  Administered 2016-09-04 – 2016-09-05 (×3): 650 mg via ORAL
  Filled 2016-09-04 (×3): qty 2

## 2016-09-04 MED ORDER — DIPHENHYDRAMINE HCL 25 MG PO CAPS: 25.0000 mg | ORAL_CAPSULE | Freq: Four times a day (QID) | ORAL | Status: DC | PRN

## 2016-09-04 MED ORDER — SIMETHICONE 80 MG PO CHEW: 80.0000 mg | CHEWABLE_TABLET | ORAL | Status: DC | PRN

## 2016-09-04 MED ORDER — ONDANSETRON HCL 4 MG/2ML IJ SOLN: 4.0000 mg | INTRAMUSCULAR | Status: DC | PRN

## 2016-09-04 MED ORDER — OXYCODONE-ACETAMINOPHEN 5-325 MG PO TABS: 1.0000 | ORAL_TABLET | ORAL | Status: DC | PRN

## 2016-09-04 MED ORDER — LIDOCAINE HCL (PF) 1 % IJ SOLN
INTRAMUSCULAR | Status: DC | PRN
  Administered 2016-09-04 (×2): 5 mL via EPIDURAL

## 2016-09-04 MED ORDER — LACTATED RINGERS IV SOLN: 500.0000 mL | INTRAVENOUS | Status: DC | PRN

## 2016-09-04 MED ORDER — MISOPROSTOL 25 MCG QUARTER TABLET
25.0000 ug | ORAL_TABLET | ORAL | Status: DC | PRN
  Administered 2016-09-04 (×2): 25 ug via VAGINAL
  Filled 2016-09-04 (×2): qty 0.25

## 2016-09-04 MED ORDER — DIBUCAINE 1 % RE OINT: 1.0000 "application " | TOPICAL_OINTMENT | RECTAL | Status: DC | PRN

## 2016-09-04 MED ORDER — OXYTOCIN BOLUS FROM INFUSION
500.0000 mL | Freq: Once | INTRAVENOUS | Status: AC
  Administered 2016-09-04: 500 mL via INTRAVENOUS

## 2016-09-04 MED ORDER — FERROUS SULFATE 325 (65 FE) MG PO TABS
325.0000 mg | ORAL_TABLET | Freq: Two times a day (BID) | ORAL | Status: DC
  Administered 2016-09-05 (×2): 325 mg via ORAL
  Filled 2016-09-04 (×2): qty 1

## 2016-09-04 MED ORDER — EPHEDRINE 5 MG/ML INJ: 10.0000 mg | INTRAVENOUS | Status: DC | PRN

## 2016-09-04 MED ORDER — LACTATED RINGERS IV SOLN
INTRAVENOUS | Status: DC
  Administered 2016-09-04 (×3): via INTRAVENOUS

## 2016-09-04 MED ORDER — BUTORPHANOL TARTRATE 1 MG/ML IJ SOLN
2.0000 mg | INTRAMUSCULAR | Status: DC | PRN
  Administered 2016-09-04 (×2): 2 mg via INTRAVENOUS
  Filled 2016-09-04 (×2): qty 2

## 2016-09-04 MED ORDER — PRENATAL MULTIVITAMIN CH
1.0000 | ORAL_TABLET | Freq: Every day | ORAL | Status: DC
  Administered 2016-09-05: 1 via ORAL
  Filled 2016-09-04: qty 1

## 2016-09-04 MED ORDER — TERBUTALINE SULFATE 1 MG/ML IJ SOLN: 0.2500 mg | Freq: Once | INTRAMUSCULAR | Status: DC | PRN

## 2016-09-04 MED ORDER — WITCH HAZEL-GLYCERIN EX PADS
1.0000 "application " | MEDICATED_PAD | CUTANEOUS | Status: DC | PRN
  Administered 2016-09-04: 1 via TOPICAL

## 2016-09-04 MED ORDER — LIDOCAINE HCL (PF) 1 % IJ SOLN
30.0000 mL | INTRAMUSCULAR | Status: DC | PRN
  Filled 2016-09-04 (×2): qty 30

## 2016-09-04 MED ORDER — ACETAMINOPHEN 325 MG PO TABS: 650.0000 mg | ORAL_TABLET | ORAL | Status: DC | PRN

## 2016-09-04 MED ORDER — PENICILLIN G POT IN DEXTROSE 60000 UNIT/ML IV SOLN
3.0000 10*6.[IU] | INTRAVENOUS | Status: DC
  Administered 2016-09-04: 3 10*6.[IU] via INTRAVENOUS
  Filled 2016-09-04 (×6): qty 50

## 2016-09-04 MED ORDER — ONDANSETRON HCL 4 MG PO TABS: 4.0000 mg | ORAL_TABLET | ORAL | Status: DC | PRN

## 2016-09-04 MED ORDER — OXYTOCIN 40 UNITS IN LACTATED RINGERS INFUSION - SIMPLE MED
1.0000 m[IU]/min | INTRAVENOUS | Status: DC
  Administered 2016-09-04: 2 m[IU]/min via INTRAVENOUS
  Filled 2016-09-04: qty 1000

## 2016-09-04 MED ORDER — PHENYLEPHRINE 40 MCG/ML (10ML) SYRINGE FOR IV PUSH (FOR BLOOD PRESSURE SUPPORT)
80.0000 ug | PREFILLED_SYRINGE | INTRAVENOUS | Status: DC | PRN
  Filled 2016-09-04: qty 10

## 2016-09-04 NOTE — Progress Notes (Signed)
S: comfortable after Epidural  O: BP 120/72   Pulse 75   Temp (P) 97.8 F (36.6 C) (Oral)   Resp (P) 16   Ht 5\' 3"  (1.6 m)   Wt 98 kg (216 lb)   SpO2 100%   BMI 38.26 kg/m   Pitocin VE 4/90/+1 AROM  Light mec LOT IUPC/ISE placed Tracing: baseline 140 (+) early decel (+) accel Ctx q 2-4 mins IMP: Active phase Term P) right exaggerated sims. Cont pitocin

## 2016-09-04 NOTE — Progress Notes (Signed)
S;  Notes painful ctx S/p cytotec x 2   O: VE 2/70/-1 posterior  Tracing: baseline130 (+) accels Ctx ? q  IMP: latent phase Term gestation GBS cx (+)  P) defer amniotomy until IV PCN adequate coverage. Pitocin prn. IV stadol prn pain

## 2016-09-04 NOTE — H&P (Signed)
Beth Carey is a 25 y.o. female presenting for IOL @ term due to multiparity. PNC complicated by GERD and HEG throughout pregnancy (+) GBS OB History    Gravida Para Term Preterm AB Living   5 1 1   3 1    SAB TAB Ectopic Multiple Live Births   1 2     1      Past Medical History:  Diagnosis Date  . Asthma    as  child  . Depression    day by day, in therapy  . Medical history non-contributory   . Vaginal Pap smear, abnormal    f/u was normal   Past Surgical History:  Procedure Laterality Date  . THERAPEUTIC ABORTION     Family History: family history includes Heart disease in her maternal grandfather, maternal grandmother, mother, and paternal grandmother; Hyperlipidemia in her maternal grandmother; Hypertension in her maternal grandmother; Mental illness in her paternal grandmother. Social History:  reports that she has never smoked. She has never used smokeless tobacco. She reports that she does not drink alcohol or use drugs.     Maternal Diabetes: No Genetic Screening: Normal Maternal Ultrasounds/Referrals: Normal Fetal Ultrasounds or other Referrals:  None Maternal Substance Abuse:  No Significant Maternal Medications:  Meds include: Protonix Significant Maternal Lab Results:  Lab values include: Group B Strep positive Other Comments:  HEG, GERD  Review of Systems  All other systems reviewed and are negative.  History Dilation: 1 Station: -3 Exam by:: Beth Kung Stewart, RN Blood pressure 118/72, pulse 95, temperature 98.2 F (36.8 C), temperature source Oral, resp. rate 18, height 5\' 3"  (1.6 m), weight 98 kg (216 lb). Exam Physical Exam  Constitutional: She appears well-developed and well-nourished.  HENT:  Head: Atraumatic.  Eyes: EOM are normal.  Neck: Neck supple.  Cardiovascular: Regular rhythm.   Respiratory: Breath sounds normal.  GI: Soft.  Skin: Skin is warm and dry.  Psychiatric: She has a normal mood and affect.    Prenatal labs: ABO, Rh: --/--/O  POS (12/16 0050) Antibody: PENDING (12/16 0050) Rubella: Immune (05/24 0000) RPR: Nonreactive (05/24 0000)  HBsAg: Negative (05/24 0000)  HIV: Non-reactive (05/24 0000)  GBS: Positive (11/17 0000)   Assessment/Plan: Multiparity Term gestation GBS cx (+) P) admit. Routine labs. Cytotec. IV PCN   Beth Carey A 09/04/2016, 2:35 AM

## 2016-09-04 NOTE — Anesthesia Procedure Notes (Signed)
Epidural Patient location during procedure: OB Start time: 09/04/2016 2:50 PM End time: 09/04/2016 3:04 PM  Staffing Anesthesiologist: Heather RobertsSINGER, Jaycie Kregel Performed: anesthesiologist   Preanesthetic Checklist Completed: patient identified, site marked, pre-op evaluation, timeout performed, IV checked, risks and benefits discussed and monitors and equipment checked  Epidural Patient position: sitting Prep: DuraPrep Patient monitoring: heart rate, cardiac monitor, continuous pulse ox and blood pressure Approach: midline Location: L2-L3 Injection technique: LOR saline  Needle:  Needle type: Tuohy  Needle gauge: 17 G Needle length: 9 cm Needle insertion depth: 7 cm Catheter size: 20 Guage Catheter at skin depth: 12 cm Test dose: negative and Other  Assessment Events: blood not aspirated, injection not painful, no injection resistance and negative IV test  Additional Notes Informed consent obtained prior to proceeding including risk of failure, 1% risk of PDPH, risk of minor discomfort and bruising.  Discussed rare but serious complications including epidural abscess, permanent nerve injury, epidural hematoma.  Discussed alternatives to epidural analgesia and patient desires to proceed.  Timeout performed pre-procedure verifying patient name, procedure, and platelet count.  Patient tolerated procedure well.

## 2016-09-04 NOTE — Anesthesia Pain Management Evaluation Note (Signed)
  CRNA Pain Management Visit Note  Patient: Beth Carey, 25 y.o., female  "Hello I am a member of the anesthesia team at [facility]. We have an anesthesia team available at all times to provide care throughout the hospital, including epidural management and anesthesia for C-section. I don't know your plan for the delivery whether it a natural birth, water birth, IV sedation, nitrous supplementation, doula or epidural, but we want to meet your pain goals."   1.Was your pain managed to your expectations on prior hospitalizations?   No   2.What is your expectation for pain management during this hospitalization?     Epidural  3.How can we help you reach that goal?   Record the patient's initial score and the patient's pain goal.   Pain: 6  Pain Goal: 6 [facility] wants you to be able to say your pain was always managed very well.  Laban EmperorMalinova,Zakarie Sturdivant Hristova 09/04/2016

## 2016-09-04 NOTE — Anesthesia Preprocedure Evaluation (Signed)
Anesthesia Evaluation  Patient identified by MRN, date of birth, ID band Patient awake    Reviewed: Allergy & Precautions, NPO status , Patient's Chart, lab work & pertinent test results  Airway Mallampati: II  TM Distance: >3 FB Neck ROM: Full    Dental no notable dental hx. (+) Dental Advisory Given   Pulmonary neg pulmonary ROS,    Pulmonary exam normal        Cardiovascular negative cardio ROS Normal cardiovascular exam     Neuro/Psych PSYCHIATRIC DISORDERS Depression negative neurological ROS     GI/Hepatic negative GI ROS, Neg liver ROS,   Endo/Other  negative endocrine ROS  Renal/GU negative Renal ROS  negative genitourinary   Musculoskeletal negative musculoskeletal ROS (+)   Abdominal   Peds negative pediatric ROS (+)  Hematology negative hematology ROS (+)   Anesthesia Other Findings   Reproductive/Obstetrics (+) Pregnancy                             Anesthesia Physical Anesthesia Plan  ASA: III  Anesthesia Plan: Epidural   Post-op Pain Management:    Induction:   Airway Management Planned:   Additional Equipment:   Intra-op Plan:   Post-operative Plan:   Informed Consent: I have reviewed the patients History and Physical, chart, labs and discussed the procedure including the risks, benefits and alternatives for the proposed anesthesia with the patient or authorized representative who has indicated his/her understanding and acceptance.   Dental advisory given  Plan Discussed with: Anesthesiologist  Anesthesia Plan Comments:         Anesthesia Quick Evaluation

## 2016-09-05 LAB — RPR: RPR Ser Ql: NONREACTIVE

## 2016-09-05 LAB — CBC
HCT: 34.5 % — ABNORMAL LOW (ref 36.0–46.0)
Hemoglobin: 11.8 g/dL — ABNORMAL LOW (ref 12.0–15.0)
MCH: 30.9 pg (ref 26.0–34.0)
MCHC: 34.2 g/dL (ref 30.0–36.0)
MCV: 90.3 fL (ref 78.0–100.0)
Platelets: 382 10*3/uL (ref 150–400)
RBC: 3.82 MIL/uL — ABNORMAL LOW (ref 3.87–5.11)
RDW: 14.3 % (ref 11.5–15.5)
WBC: 12.8 10*3/uL — ABNORMAL HIGH (ref 4.0–10.5)

## 2016-09-05 MED ORDER — OXYCODONE-ACETAMINOPHEN 5-325 MG PO TABS
1.0000 | ORAL_TABLET | ORAL | Status: DC | PRN
  Administered 2016-09-05 – 2016-09-06 (×4): 1 via ORAL
  Filled 2016-09-05 (×4): qty 1

## 2016-09-05 NOTE — Anesthesia Postprocedure Evaluation (Signed)
Anesthesia Post Note  Patient: Beth Carey  Procedure(s) Performed: * No procedures listed *  Patient location during evaluation: Mother Baby Anesthesia Type: Epidural Level of consciousness: awake and alert, oriented and patient cooperative Pain management: pain level controlled Vital Signs Assessment: post-procedure vital signs reviewed and stable Respiratory status: spontaneous breathing Cardiovascular status: stable Postop Assessment: no headache, epidural receding, patient able to bend at knees and no signs of nausea or vomiting Anesthetic complications: no Comments: Pain score 5.  Pt states she received pain med at 0500 and just requested additional pain med from RN.     Last Vitals:  Vitals:   09/05/16 0103 09/05/16 0520  BP: 103/65 121/71  Pulse: 67 68  Resp: 18 18  Temp: 37 C 36.9 C    Last Pain:  Vitals:   09/05/16 0525  TempSrc:   PainSc: 2    Pain Goal:                 [facility] Center[facility]INKLE,[name]

## 2016-09-05 NOTE — Progress Notes (Signed)
MOB was referred for history of depression/anxiety. * Referral screened out by Clinical Social Worker because none of the following criteria appear to apply: ~ History of anxiety/depression during this pregnancy, or of post-partum depression. ~ Diagnosis of anxiety and/or depression within last 3 years OR * MOB's symptoms currently being treated with medication and/or therapy. Please contact the Clinical Social Worker if needs arise, or if MOB requests.   

## 2016-09-05 NOTE — Lactation Note (Signed)
This note was copied from a baby's chart. Lactation Consultation Note  Patient Name: Beth Carey Today's Date: 09/05/2016 Reason for consult: Initial assessment This is Mom's 1st time BF. Baby not latching well per Mom, RN set up DEBP for Mom to start pumping. LC attempted to latch baby at this visit but too sleepy. Demonstrated jaw massage and suck training to Mom to help with latch, Baby humping her tongue with suck exam. Mom has deep dimpling center of nipple, compressible. Mom given breast shells to wear. Advised to pre-pump to help with latch. Advised to be pumping with DEBP every 3 hours for 15 minutes till baby latching to encourage milk production. Mom plans to continue to supplement till baby latching well. Reviewed basic teaching with Mom, encouraged to BF with feeding ques. Lactation brochure left for review, advised of OP services and support group. Mom to call with next feeding for [facility] assist with latch.    Maternal Data Has patient been taught Hand Expression?: Yes Does the patient have breastfeeding experience prior to this delivery?: No  Feeding Feeding Type: Breast Fed Nipple Type: Slow - flow Length of feed: 0 min  LATCH Score/Interventions Latch: Too sleepy or reluctant, no latch achieved, no sucking elicited.     Type of Nipple: Inverted Intervention(s): Shells;Hand pump;Double electric pump              Lactation Tools Discussed/Used Tools: Shells;Pump Shell Type: Inverted Breast pump type: Double-Electric Breast Pump WIC Program: Yes   Consult Status Consult Status: Follow-up Date: 09/05/16 Follow-up type: In-patient    Beth Carey, Beth Carey 09/05/2016, 3:05 PM

## 2016-09-05 NOTE — Progress Notes (Signed)
PPD # 1 SVD Information for the patient's newborn:  Frederich Chickzzard, Girl [name] [[id number]][[id number]]  female    breast and bottle feeding   Baby name: Nyomi  S:  Reports feeling well, sore bottom.             Tolerating po/ No nausea or vomiting             Bleeding is decreasing.             Pain controlled with ibuprofen (OTC)             Up ad lib / ambulatory / voiding without difficulties        O:  A & O x 3, in no apparent distress              VS:  Vitals:   09/04/16 1845 09/04/16 1935 09/05/16 0103 09/05/16 0520  BP: 109/72 113/63 103/65 121/71  Pulse: 71 74 67 68  Resp: 18 18 18 18   Temp: 98.8 F (37.1 C) 98.7 F (37.1 C) 98.6 F (37 C) 98.4 F (36.9 C)  TempSrc: Oral Oral Oral Oral  SpO2:   97% 98%  Weight:      Height:        LABS:  Recent Labs  09/04/16 0050 09/05/16 0522  WBC 7.7 12.8*  HGB 11.9* 11.8*  HCT 34.4* 34.5*  PLT 408* 382    Blood type: --/--/O POS, O POS (12/16 0050)  Rubella: Immune (05/24 0000)   I&O: I/O last 3 completed shifts: In: -  Out: 250 [Blood:250]          No intake/output data recorded.  Lungs: Clear and unlabored  Heart: regular rate and rhythm / no murmurs  Abdomen: soft, non-tender, non-distended             Fundus: firm, non-tender, U-1  Perineum: intact, no edema, small hemorrhoidal tag at anus  Lochia: small  Extremities: trace pedal edema, no calf pain or tenderness    A/P: PPD # 1 25 y.o., [id number]   Principal Problem:   Postpartum care following vaginal delivery (12/16)  LC support for breastfeeding  Doing well - stable status  Routine post partum orders  Anticipate discharge tomorrow    Neta Mendsaniela C Carisma Troupe, MSN, CNM 09/05/2016, 9:58 AM

## 2016-09-06 MED ORDER — IBUPROFEN 600 MG PO TABS: 600.0000 mg | ORAL_TABLET | Freq: Four times a day (QID) | ORAL | 0 refills | Status: DC

## 2016-09-06 NOTE — Lactation Note (Signed)
This note was copied from a baby's chart. Lactation Consultation Note  Patient Name: Beth Carey ZOXWR'UToday's Date: 09/06/2016 Reason for consult: Follow-up assessment   With this mom, who wants to pump and bottle feed, and form now os formula feeding. Mom has large but normal breasts. She is now 41 hours ost partum. With hand expression, she ws able to express small drops of colostrum. Mom said pumping was not working. I advised her to pump every 3 hours, at least 15 minutes, or until her milk stops dripping, to protect her milk supply. Mom knows to call for lactation for questions/conerns, support group and o/p lactation reviewed with mom. Breast care/ engorgement care reviewed. Mom advised to wear a looser bra, to allow her breasts to fill. Mom knows to call for questions/conerns.    Maternal Data    Feeding    LATCH Score/Interventions                      Lactation Tools Discussed/Used     Consult Status Consult Status: Complete Follow-up type: Call as needed    Alfred LevinsLee, Dryden Tapley Anne 09/06/2016, 10:17 AM

## 2016-09-06 NOTE — Progress Notes (Signed)
PPD # 2 SVD Information for the patient's newborn:  Frederich Chickzzard, Girl [name] [[id number]][[id number]]  female    breast and bottle feeding   Baby name: Nyomi  S:  Reports feeling well, sore bottom.             Tolerating po/ No nausea or vomiting             Bleeding is decreasing.             Pain controlled with ibuprofen (OTC)             Up ad lib / ambulatory / voiding without difficulties        O:  A & O x 3, in no apparent distress              VS:  Vitals:   09/05/16 0103 09/05/16 0520 09/05/16 1800 09/06/16 0634  BP: 103/65 121/71 118/63 116/65  Pulse: 67 68 63 70  Resp: 18 18 20 20   Temp: 98.6 F (37 C) 98.4 F (36.9 C) 98.7 F (37.1 C) 98 F (36.7 C)  TempSrc: Oral Oral Oral Oral  SpO2: 97% 98% 97% 98%  Weight:      Height:        LABS:   Recent Labs  09/04/16 0050 09/05/16 0522  WBC 7.7 12.8*  HGB 11.9* 11.8*  HCT 34.4* 34.5*  PLT 408* 382    Blood type: O POS (12/16 0050)  Rubella: Immune (05/24 0000)   I&O: No intake/output data recorded.          No intake/output data recorded.  Lungs: Clear and unlabored  Heart: regular rate and rhythm / no murmurs  Abdomen: soft, non-tender, non-distended             Fundus: firm, non-tender, U-1  Perineum: intact, no edema, small hemorrhoidal tag at anus  Lochia: small  Extremities: trace pedal edema, no calf pain or tenderness    A/P: PPD # 2 25 y.o., [id number]   Principal Problem:   Postpartum care following vaginal delivery (12/16)    Doing well - stable status  Routine post partum orders  Discharge home today    Raelyn MoraAWSON, Hermelinda Diegel, M, MSN, CNM 09/06/2016, 9:11 AM

## 2016-09-06 NOTE — Discharge Summary (Signed)
OB Discharge Summary     Patient Name: Beth JupiterBrittany Bache DOB: [birth year] MRN: [id number]  Date of admission: 09/04/2016 Delivering MD: Tonyetta Berko   Date of discharge: 09/06/2016  Admitting diagnosis: induction Intrauterine pregnancy: [id number]     Secondary diagnosis:  Principal Problem:   Postpartum care following vaginal delivery (12/16)  Additional problems: none     Discharge diagnosis: Term Pregnancy Delivered                                                                                                Post partum procedures:none  Augmentation: AROM, Pitocin and Cytotec  Complications: None  Hospital course:  Induction of Labor With Vaginal Delivery   25 y.o. yo [id number] at 5558w4d was admitted to the hospital 09/04/2016 for induction of labor.  Indication for induction: multiparity.  Patient had an uncomplicated labor course as follows: Membrane Rupture Time/Date: 3:12 PM ,09/04/2016   Intrapartum Procedures: Episiotomy: None [1]                                         Lacerations:  None [1]  Patient had delivery of a Viable infant.  Information for the patient's newborn:  Frederich Chickzzard, Girl [name] [[id number]][[id number]]  Delivery Method: Vag-Spont   09/04/2016  Details of delivery can be found in separate delivery note.  Patient had a routine postpartum course. Patient is discharged home 09/06/16.   Physical exam Vitals:   09/05/16 0103 09/05/16 0520 09/05/16 1800 09/06/16 0634  BP: 103/65 121/71 118/63 116/65  Pulse: 67 68 63 70  Resp: 18 18 20 20   Temp: 98.6 F (37 C) 98.4 F (36.9 C) 98.7 F (37.1 C) 98 F (36.7 C)  TempSrc: Oral Oral Oral Oral  SpO2: 97% 98% 97% 98%  Weight:      Height:       General: alert, cooperative and no distress Lochia: appropriate Uterine Fundus: firm, midline, U-1 DVT Evaluation: No evidence of DVT seen on physical exam. Negative Homan's sign. No cords or calf tenderness. Trace calf/ankle edema.  Labs: Lab Results  Component  Value Date   WBC 12.8 (H) 09/05/2016   HGB 11.8 (L) 09/05/2016   HCT 34.5 (L) 09/05/2016   MCV 90.3 09/05/2016   PLT 382 09/05/2016   CMP Latest Ref Rng & Units 07/28/2016  Glucose 65 - 99 mg/dL 77  BUN 6 - 20 mg/dL 6  Creatinine 6.960.44 - 2.951.00 mg/dL 2.840.58  Sodium 132135 - 440145 mmol/L 136  Potassium 3.5 - 5.1 mmol/L 3.6  Chloride 101 - 111 mmol/L 105  CO2 22 - 32 mmol/L 22  Calcium 8.9 - 10.3 mg/dL 1.0(U8.8(L)  Total Protein 6.5 - 8.1 g/dL 7.2  Total Bilirubin 0.3 - 1.2 mg/dL 1.1  Alkaline Phos 38 - 126 U/L 96  AST 15 - 41 U/L 15  ALT 14 - 54 U/L 11(L)    Discharge instruction: per After Visit Summary and "Baby and Me Booklet".  After visit meds:  Allergies as of 09/06/2016  Reactions   Zantac [ranitidine Hcl] Hives      Medication List    TAKE these medications   ibuprofen 600 MG tablet Commonly known as:  ADVIL,MOTRIN Take 1 tablet (600 mg total) by mouth every 6 (six) hours.   pantoprazole 40 MG tablet Commonly known as:  PROTONIX Take 40 mg by mouth 2 (two) times daily.   prenatal multivitamin Tabs tablet Take 1 tablet by mouth at bedtime.       Diet: routine diet  Activity: Advance as tolerated. Pelvic rest for 6 weeks.   Outpatient follow up:6 weeks Follow up Appt:No future appointments. Follow up Visit:No Follow-up on file.  Postpartum contraception: Undecided  Newborn Data: Live born female on 09/04/2016 Birth Weight: 7 lb 0.2 oz (3181 g) APGAR: 9, 9  Baby Feeding: Bottle - expressed breastmilk Disposition:home with mother   09/06/2016 Raelyn MoraAWSON, ROLITTA, Judie PetitM, CNM

## 2016-09-06 NOTE — Discharge Instructions (Signed)
Postpartum Depression and Baby Blues °The postpartum period begins right after the birth of a baby. During this time, there is often a great amount of joy and excitement. It is also a time of many changes in the life of the parents. Regardless of how many times a mother gives birth, each child brings new challenges and dynamics to the family. It is not unusual to have feelings of excitement along with confusing shifts in moods, emotions, and thoughts. All mothers are at risk of developing postpartum depression or the "baby blues." These mood changes can occur right after giving birth, or they may occur many months after giving birth. The baby blues or postpartum depression can be mild or severe. Additionally, postpartum depression can go away rather quickly, or it can be a long-term condition. °What are the causes? °Raised hormone levels and the rapid drop in those levels are thought to be a main cause of postpartum depression and the baby blues. A number of hormones change during and after pregnancy. Estrogen and progesterone usually decrease right after the delivery of your baby. The levels of thyroid hormone and various cortisol steroids also rapidly drop. Other factors that play a role in these mood changes include major life events and genetics. °What increases the risk? °If you have any of the following risks for the baby blues or postpartum depression, know what symptoms to watch out for during the postpartum period. Risk factors that may increase the likelihood of getting the baby blues or postpartum depression include: °· Having a personal or family history of depression. °· Having depression while being pregnant. °· Having premenstrual mood issues or mood issues related to oral contraceptives. °· Having a lot of life stress. °· Having marital conflict. °· Lacking a social support network. °· Having a baby with special needs. °· Having health problems, such as diabetes. °What are the signs or  symptoms? °Symptoms of baby blues include: °· Brief changes in mood, such as going from extreme happiness to sadness. °· Decreased concentration. °· Difficulty sleeping. °· Crying spells, tearfulness. °· Irritability. °· Anxiety. °Symptoms of postpartum depression typically begin within the first month after giving birth. These symptoms include: °· Difficulty sleeping or excessive sleepiness. °· Marked weight loss. °· Agitation. °· Feelings of worthlessness. °· Lack of interest in activity or food. °Postpartum psychosis is a very serious condition and can be dangerous. Fortunately, it is rare. Displaying any of the following symptoms is cause for immediate medical attention. Symptoms of postpartum psychosis include: °· Hallucinations and delusions. °· Bizarre or disorganized behavior. °· Confusion or disorientation. °How is this diagnosed? °A diagnosis is made by an evaluation of your symptoms. There are no medical or lab tests that lead to a diagnosis, but there are various questionnaires that a health care provider may use to identify those with the baby blues, postpartum depression, or psychosis. Often, a screening tool called the Edinburgh Postnatal Depression Scale is used to diagnose depression in the postpartum period. °How is this treated? °The baby blues usually goes away on its own in 1-2 weeks. Social support is often all that is needed. You will be encouraged to get adequate sleep and rest. Occasionally, you may be given medicines to help you sleep. °Postpartum depression requires treatment because it can last several months or longer if it is not treated. Treatment may include individual or group therapy, medicine, or both to address any social, physiological, and psychological factors that may play a role in the depression. Regular exercise, a   healthy diet, rest, and social support may also be strongly recommended. °Postpartum psychosis is more serious and needs treatment right away. Hospitalization is  often needed. °Follow these instructions at home: °· Get as much rest as you can. Nap when the baby sleeps. °· Exercise regularly. Some women find yoga and walking to be beneficial. °· Eat a balanced and nourishing diet. °· Do little things that you enjoy. Have a cup of tea, take a bubble bath, read your favorite magazine, or listen to your favorite music. °· Avoid alcohol. °· Ask for help with household chores, cooking, grocery shopping, or running errands as needed. Do not try to do everything. °· Talk to people close to you about how you are feeling. Get support from your partner, family members, friends, or other new moms. °· Try to stay positive in how you think. Think about the things you are grateful for. °· Do not spend a lot of time alone. °· Only take over-the-counter or prescription medicine as directed by your health care provider. °· Keep all your postpartum appointments. °· Let your health care provider know if you have any concerns. °Contact a health care provider if: °You are having a reaction to or problems with your medicine. °Get help right away if: °· You have suicidal feelings. °· You think you may harm the baby or someone else. °This information is not intended to replace advice given to you by your health care provider. Make sure you discuss any questions you have with your health care provider. °Document Released: 06/10/2004 Document Revised: 02/12/2016 Document Reviewed: 06/18/2013 °[employer]vier Interactive Patient Education © 2017 [employer] °Postpartum Care After Vaginal Delivery °The period of time right after you deliver your newborn is called the postpartum period. °What kind of medical care will I receive? °· You may continue to receive fluids and medicines through an IV tube inserted into one of your veins. °· If an incision was made near your vagina (episiotomy) or if you had some vaginal tearing during delivery, cold compresses may be placed on your episiotomy or your tear. This  helps to reduce pain and swelling. °· You may be given a squirt bottle to use when you go to the bathroom. You may use this until you are comfortable wiping as usual. To use the squirt bottle, follow these steps: °¨ Before you urinate, fill the squirt bottle with warm water. Do not use hot water. °¨ After you urinate, while you are sitting on the toilet, use the squirt bottle to rinse the area around your urethra and vaginal opening. This rinses away any urine and blood. °¨ You may do this instead of wiping. As you start healing, you may use the squirt bottle before wiping yourself. Make sure to wipe gently. °¨ Fill the squirt bottle with clean water every time you use the bathroom. °· You will be given sanitary pads to wear. °How can I expect to feel? °· You may not feel the need to urinate for several hours after delivery. °· You will have some soreness and pain in your abdomen and vagina. °· If you are breastfeeding, you may have uterine contractions every time you breastfeed for up to several weeks postpartum. Uterine contractions help your uterus return to its normal size. °· It is normal to have vaginal bleeding (lochia) after delivery. The amount and appearance of lochia is often similar to a menstrual period in the first week after delivery. It will gradually decrease over the next few weeks to a   dry, yellow-brown discharge. For most women, lochia stops completely by 6-8 weeks after delivery. Vaginal bleeding can vary from woman to woman. °· Within the first few days after delivery, you may have breast engorgement. This is when your breasts feel heavy, full, and uncomfortable. Your breasts may also throb and feel hard, tightly stretched, warm, and tender. After this occurs, you may have milk leaking from your breasts. Your health care provider can help you relieve discomfort due to breast engorgement. Breast engorgement should go away within a few days. °· You may feel more sad or worried than normal due to  hormonal changes after delivery. These feelings should not last more than a few days. If these feelings do not go away after several days, speak with your health care provider. °How should I care for myself? °· Tell your health care provider if you have pain or discomfort. °· Drink enough water to keep your urine clear or pale yellow. °· Wash your hands thoroughly with soap and water for at least 20 seconds after changing your sanitary pads, after using the toilet, and before holding or feeding your baby. °· If you are not breastfeeding, avoid touching your breasts a lot. Doing this can make your breasts produce more milk. °· If you become weak or lightheaded, or you feel like you might faint, ask for help before: °¨ Getting out of bed. °¨ Showering. °· Change your sanitary pads frequently. Watch for any changes in your flow, such as a sudden increase in volume, a change in color, the passing of large blood clots. If you pass a blood clot from your vagina, save it to show to your health care provider. Do not flush blood clots down the toilet without having your health care provider look at them. °· Make sure that all your vaccinations are up to date. This can help protect you and your baby from getting certain diseases. You may need to have immunizations done before you leave the hospital. °· If desired, talk with your health care provider about methods of family planning or birth control (contraception). °How can I start bonding with my baby? °Spending as much time as possible with your baby is very important. During this time, you and your baby can get to know each other and develop a bond. Having your baby stay with you in your room (rooming in) can give you time to get to know your baby. Rooming in can also help you become comfortable caring for your baby. Breastfeeding can also help you bond with your baby. °How can I plan for returning home with my baby? °· Make sure that you have a car seat installed in your  vehicle. °¨ Your car seat should be checked by a certified car seat installer to make sure that it is installed safely. °¨ Make sure that your baby fits into the car seat safely. °· Ask your health care provider any questions you have about caring for yourself or your baby. Make sure that you are able to contact your health care provider with any questions after leaving the hospital. °This information is not intended to replace advice given to you by your health care provider. Make sure you discuss any questions you have with your health care provider. °Document Released: 07/04/2007 Document Revised: 02/09/2016 Document Reviewed: 08/11/2015 °[employer]vier Interactive Patient Education © 2017 [employer] °Breastfeeding °Deciding to breastfeed is one of the best choices you can make for you and your baby. A change in hormones during pregnancy causes   your breast tissue to grow and increases the number and size of your milk ducts. These hormones also allow proteins, sugars, and fats from your blood supply to make breast milk in your milk-producing glands. Hormones prevent breast milk from being released before your baby is born as well as prompt milk flow after birth. Once breastfeeding has begun, thoughts of your baby, as well as his or her sucking or crying, can stimulate the release of milk from your milk-producing glands. °Benefits of breastfeeding °For Your Baby °· Your first milk (colostrum) helps your baby's digestive system function better. °· There are antibodies in your milk that help your baby fight off infections. °· Your baby has a lower incidence of asthma, allergies, and sudden infant death syndrome. °· The nutrients in breast milk are better for your baby than infant formulas and are designed uniquely for your baby’s needs. °· Breast milk improves your baby's brain development. °· Your baby is less likely to develop other conditions, such as childhood obesity, asthma, or type 2 diabetes mellitus. °For  You °· Breastfeeding helps to create a very special bond between you and your baby. °· Breastfeeding is convenient. Breast milk is always available at the correct temperature and costs nothing. °· Breastfeeding helps to burn calories and helps you lose the weight gained during pregnancy. °· Breastfeeding makes your uterus contract to its prepregnancy size faster and slows bleeding (lochia) after you give birth. °· Breastfeeding helps to lower your risk of developing type 2 diabetes mellitus, osteoporosis, and breast or ovarian cancer later in life. °Signs that your baby is hungry °Early Signs of Hunger °· Increased alertness or activity. °· Stretching. °· Movement of the head from side to side. °· Movement of the head and opening of the mouth when the corner of the mouth or cheek is stroked (rooting). °· Increased sucking sounds, smacking lips, cooing, sighing, or squeaking. °· Hand-to-mouth movements. °· Increased sucking of fingers or hands. °Late Signs of Hunger °· Fussing. °· Intermittent crying. °Extreme Signs of Hunger  °Signs of extreme hunger will require calming and consoling before your baby will be able to breastfeed successfully. Do not wait for the following signs of extreme hunger to occur before you initiate breastfeeding: °· Restlessness. °· A loud, strong cry. °· Screaming. °Breastfeeding basics ° Breastfeeding Initiation °· Find a comfortable place to sit or lie down, with your neck and back well supported. °· Place a pillow or rolled up blanket under your baby to bring him or her to the level of your breast (if you are seated). Nursing pillows are specially designed to help support your arms and your baby while you breastfeed. °· Make sure that your baby's abdomen is facing your abdomen. °· Gently massage your breast. With your fingertips, massage from your chest wall toward your nipple in a circular motion. This encourages milk flow. You may need to continue this action during the feeding if your  milk flows slowly. °· Support your breast with 4 fingers underneath and your thumb above your nipple. Make sure your fingers are well away from your nipple and your baby’s mouth. °· Stroke your baby's lips gently with your finger or nipple. °· When your baby's mouth is open wide enough, quickly bring your baby to your breast, placing your entire nipple and as much of the colored area around your nipple (areola) as possible into your baby's mouth. °¨ More areola should be visible above your baby's upper lip than below the lower lip. °¨   Your baby's tongue should be between his or her lower gum and your breast. °· Ensure that your baby's mouth is correctly positioned around your nipple (latched). Your baby's lips should create a seal on your breast and be turned out (everted). °· It is common for your baby to suck about 2-3 minutes in order to start the flow of breast milk. °Latching  °Teaching your baby how to latch on to your breast properly is very important. An improper latch can cause nipple pain and decreased milk supply for you and poor weight gain in your baby. Also, if your baby is not latched onto your nipple properly, he or she may swallow some air during feeding. This can make your baby fussy. Burping your baby when you switch breasts during the feeding can help to get rid of the air. However, teaching your baby to latch on properly is still the best way to prevent fussiness from swallowing air while breastfeeding. °Signs that your baby has successfully latched on to your nipple: °· Silent tugging or silent sucking, without causing you pain. °· Swallowing heard between every 3-4 sucks. °· Muscle movement above and in front of his or her ears while sucking. °Signs that your baby has not successfully latched on to nipple: °· Sucking sounds or smacking sounds from your baby while breastfeeding. °· Nipple pain. °If you think your baby has not latched on correctly, slip your finger into the corner of your baby’s  mouth to break the suction and place it between your baby's gums. Attempt breastfeeding initiation again. °Signs of Successful Breastfeeding  °Signs from your baby: °· A gradual decrease in the number of sucks or complete cessation of sucking. °· Falling asleep. °· Relaxation of his or her body. °· Retention of a small amount of milk in his or her mouth. °· Letting go of your breast by himself or herself. °Signs from you: °· Breasts that have increased in firmness, weight, and size 1-3 hours after feeding. °· Breasts that are softer immediately after breastfeeding. °· Increased milk volume, as well as a change in milk consistency and color by the fifth day of breastfeeding. °· Nipples that are not sore, cracked, or bleeding. °Signs That Your Baby is Getting Enough Milk °· Wetting at least 1-2 diapers during the first 24 hours after birth. °· Wetting at least 5-6 diapers every 24 hours for the first week after birth. The urine should be clear or pale yellow by 5 days after birth. °· Wetting 6-8 diapers every 24 hours as your baby continues to grow and develop. °· At least 3 stools in a 24-hour period by age 5 days. The stool should be soft and yellow. °· At least 3 stools in a 24-hour period by age 7 days. The stool should be seedy and yellow. °· No loss of weight greater than 10% of birth weight during the first 3 days of age. °· Average weight gain of 4-7 ounces (113-198 g) per week after age 4 days. °· Consistent daily weight gain by age 5 days, without weight loss after the age of 2 weeks. °After a feeding, your baby may spit up a small amount. This is common. °Breastfeeding frequency and duration °Frequent feeding will help you make more milk and can prevent sore nipples and breast engorgement. Breastfeed when you feel the need to reduce the fullness of your breasts or when your baby shows signs of hunger. This is called "breastfeeding on demand." Avoid introducing a pacifier to your baby   while you are working  to establish breastfeeding (the first 4-6 weeks after your baby is born). After this time you may choose to use a pacifier. Research has shown that pacifier use during the first year of a baby's life decreases the risk of sudden infant death syndrome (SIDS). °Allow your baby to feed on each breast as long as he or she wants. Breastfeed until your baby is finished feeding. When your baby unlatches or falls asleep while feeding from the first breast, offer the second breast. Because newborns are often sleepy in the first few weeks of life, you may need to awaken your baby to get him or her to feed. °Breastfeeding times will vary from baby to baby. However, the following rules can serve as a guide to help you ensure that your baby is properly fed: °· Newborns (babies 4 weeks of age or younger) may breastfeed every 1-3 hours. °· Newborns should not go longer than 3 hours during the day or 5 hours during the night without breastfeeding. °· You should breastfeed your baby a minimum of 8 times in a 24-hour period until you begin to introduce solid foods to your baby at around 6 months of age. °Breast milk pumping °Pumping and storing breast milk allows you to ensure that your baby is exclusively fed your breast milk, even at times when you are unable to breastfeed. This is especially important if you are going back to work while you are still breastfeeding or when you are not able to be present during feedings. Your lactation consultant can give you guidelines on how long it is safe to store breast milk. °A breast pump is a machine that allows you to pump milk from your breast into a sterile bottle. The pumped breast milk can then be stored in a refrigerator or freezer. Some breast pumps are operated by hand, while others use electricity. Ask your lactation consultant which type will work best for you. Breast pumps can be purchased, but some hospitals and breastfeeding support groups lease breast pumps on a monthly basis.  A lactation consultant can teach you how to hand express breast milk, if you prefer not to use a pump. °Caring for your breasts while you breastfeed °Nipples can become dry, cracked, and sore while breastfeeding. The following recommendations can help keep your breasts moisturized and healthy: °· Avoid using soap on your nipples. °· Wear a supportive bra. Although not required, special nursing bras and tank tops are designed to allow access to your breasts for breastfeeding without taking off your entire bra or top. Avoid wearing underwire-style bras or extremely tight bras. °· Air dry your nipples for 3-4 minutes after each feeding. °· Use only cotton bra pads to absorb leaked breast milk. Leaking of breast milk between feedings is normal. °· Use lanolin on your nipples after breastfeeding. Lanolin helps to maintain your skin's normal moisture barrier. If you use pure lanolin, you do not need to wash it off before feeding your baby again. Pure lanolin is not toxic to your baby. You may also hand express a few drops of breast milk and gently massage that milk into your nipples and allow the milk to air dry. °In the first few weeks after giving birth, some women experience extremely full breasts (engorgement). Engorgement can make your breasts feel heavy, warm, and tender to the touch. Engorgement peaks within 3-5 days after you give birth. The following recommendations can help ease engorgement: °· Completely empty your breasts while breastfeeding or pumping.   You may want to start by applying warm, moist heat (in the shower or with warm water-soaked hand towels) just before feeding or pumping. This increases circulation and helps the milk flow. If your baby does not completely empty your breasts while breastfeeding, pump any extra milk after he or she is finished. °· Wear a snug bra (nursing or regular) or tank top for 1-2 days to signal your body to slightly decrease milk production. °· Apply ice packs to your  breasts, unless this is too uncomfortable for you. °· Make sure that your baby is latched on and positioned properly while breastfeeding. °If engorgement persists after 48 hours of following these recommendations, contact your health care provider or a lactation consultant. °Overall health care recommendations while breastfeeding °· Eat healthy foods. Alternate between meals and snacks, eating 3 of each per day. Because what you eat affects your breast milk, some of the foods may make your baby more irritable than usual. Avoid eating these foods if you are sure that they are negatively affecting your baby. °· Drink milk, fruit juice, and water to satisfy your thirst (about 10 glasses a day). °· Rest often, relax, and continue to take your prenatal vitamins to prevent fatigue, stress, and anemia. °· Continue breast self-awareness checks. °· Avoid chewing and smoking tobacco. Chemicals from cigarettes that pass into breast milk and exposure to secondhand smoke may harm your baby. °· Avoid alcohol and drug use, including marijuana. °Some medicines that may be harmful to your baby can pass through breast milk. It is important to ask your health care provider before taking any medicine, including all over-the-counter and prescription medicine as well as vitamin and herbal supplements. °It is possible to become pregnant while breastfeeding. If birth control is desired, ask your health care provider about options that will be safe for your baby. °Contact a health care provider if: °· You feel like you want to stop breastfeeding or have become frustrated with breastfeeding. °· You have painful breasts or nipples. °· Your nipples are cracked or bleeding. °· Your breasts are red, tender, or warm. °· You have a swollen area on either breast. °· You have a fever or chills. °· You have nausea or vomiting. °· You have drainage other than breast milk from your nipples. °· Your breasts do not become full before feedings by the  fifth day after you give birth. °· You feel sad and depressed. °· Your baby is too sleepy to eat well. °· Your baby is having trouble sleeping. °· Your baby is wetting less than 3 diapers in a 24-hour period. °· Your baby has less than 3 stools in a 24-hour period. °· Your baby's skin or the white part of his or her eyes becomes yellow. °· Your baby is not gaining weight by 5 days of age. °Get help right away if: °· Your baby is overly tired (lethargic) and does not want to wake up and feed. °· Your baby develops an unexplained fever. °This information is not intended to replace advice given to you by your health care provider. Make sure you discuss any questions you have with your health care provider. °Document Released: 09/06/2005 Document Revised: 02/18/2016 Document Reviewed: 02/28/2013 °Elsevier Interactive Patient Education © 2017 Elsevier Inc. °Breast Pumping Tips °If you are breastfeeding, there may be times when you cannot feed your baby directly. Returning to work or going on a trip are common examples. Pumping allows you to store breast milk and feed it to your baby   later. °You may not get much milk when you first start to pump. Your breasts should start to make more after a few days. If you pump at the times you usually feed your baby, you may be able to keep making enough milk to feed your baby without also using formula. The more often you pump, the more milk you will produce. °When should I pump? °· You can begin to pump soon after delivery. However, some experts recommend waiting about 4 weeks before giving your infant a bottle to make sure breastfeeding is going well. °· If you plan to return to work, begin pumping a few weeks before. This will help you develop techniques that work best for you. It also lets you build up a supply of breast milk. °· When you are with your infant, feed on demand and pump after each feeding. °· When you are away from your infant for several hours, pump for about 15  minutes every 2-3 hours. Pump both breasts at the same time if you can. °· If your infant has a formula feeding, make sure to pump around the same time. °· If you drink any alcohol, wait 2 hours before pumping. °How do I prepare to pump? °Your let-down reflex is the natural reaction to stimulation that makes your breast milk flow. It is easier to stimulate this reflex when you are relaxed. Find relaxation techniques that work for you. If you have difficulty with your let-down reflex, try these methods: °· Smell one of your infant's blankets or an item of clothing. °· Look at a picture or video of your infant. °· Sit in a quiet, private space. °· Massage the breast you plan to pump. °· Place soothing warmth on the breast. °· Play relaxing music. °What are some general breast pumping tips? °· Wash your hands before you pump. You do not need to wash your nipples or breasts. °· There are three ways to pump. °¨ You can use your hand to massage and compress your breast. °¨ You can use a handheld manual pump. °¨ You can use an electric pump. °· Make sure the suction cup (flange) on the breast pump is the right size. Place the flange directly over the nipple. If it is the wrong size or placed the wrong way, it may be painful and cause nipple damage. °· If pumping is uncomfortable, apply a small amount of purified or modified lanolin to your nipple and areola. °· If you are using an electric pump, adjust the speed and suction power to be more comfortable. °· If pumping is painful or if you are not getting very much milk, you may need a different type of pump. A lactation consultant can help you determine what type of pump to use. °· Keep a full water bottle near you at all times. Drinking lots of fluid helps you make more milk. °· You can store your milk to use later. Pumped breast milk can be stored in a sealable, sterile container or plastic bag. Label all stored breast milk with the date you pumped it. °¨ Milk can stay  out at room temperature for up to 8 hours. °¨ You can store your milk in the refrigerator for up to 8 days. °¨ You can store your milk in the freezer for 3 months. Thaw frozen milk using warm water. Do not put it in the microwave. °· Do not smoke. Smoking can lower your milk supply and harm your infant. If you need help quitting, ask   your health care provider to recommend a program. °When should I call my health care provider or a lactation consultant? °· You are having trouble pumping. °· You are concerned that you are not making enough milk. °· You have nipple pain, soreness, or redness. °· You want to use birth control. Birth control pills may lower your milk supply. Talk to your health care provider about your options. °This information is not intended to replace advice given to you by your health care provider. Make sure you discuss any questions you have with your health care provider. °Document Released: 02/24/2010 Document Revised: 02/18/2016 Document Reviewed: 06/29/2013 °Elsevier Interactive Patient Education © 2017 Elsevier Inc. ° °

## 2016-12-19 IMAGING — US US OB COMP LESS 14 WK
1 series · 15 of 28 positions shown · non-contrast
Comparison: 11/16/2015

CLINICAL DATA: Two-day history of vaginal bleeding with low
abdominal pain and positive pregnancy test.

EXAM:
OBSTETRIC <14 WK US AND TRANSVAGINAL OB US
TECHNIQUE: Both transabdominal and transvaginal ultrasound examinations were
performed for complete evaluation of the gestation as well as the
maternal uterus, adnexal regions, and pelvic cul-de-sac.
Transvaginal technique was performed to assess early pregnancy.

[Series 1: us ob comp less 14 wk · 56 acquisitions, 15 frames shown]
[im 1/56]
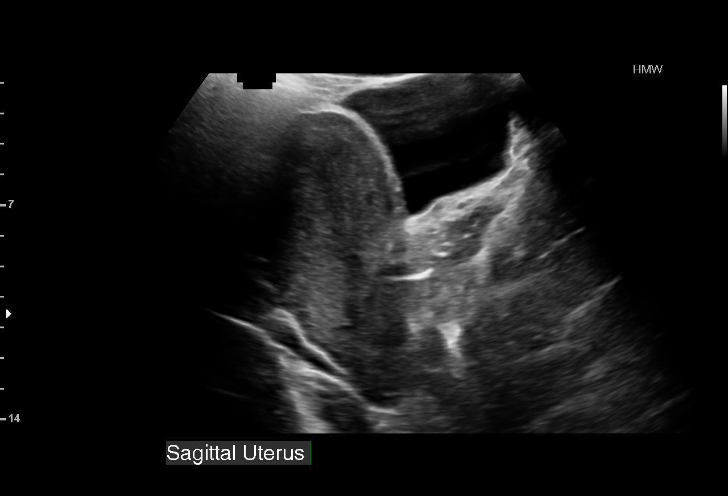
[im 5/56]
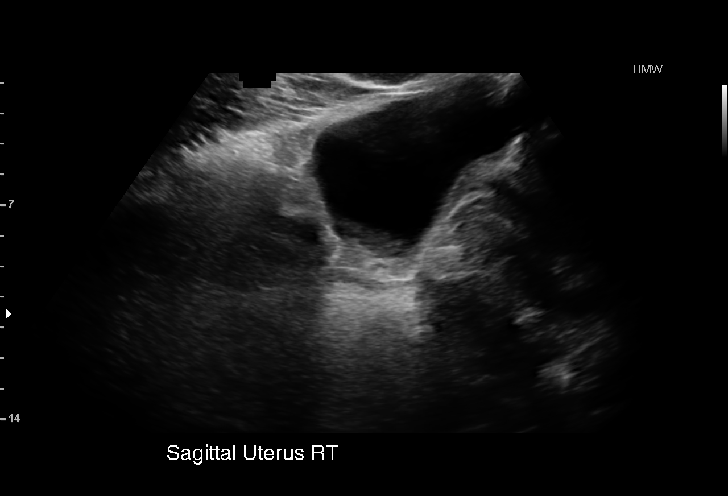
[im 9/56]
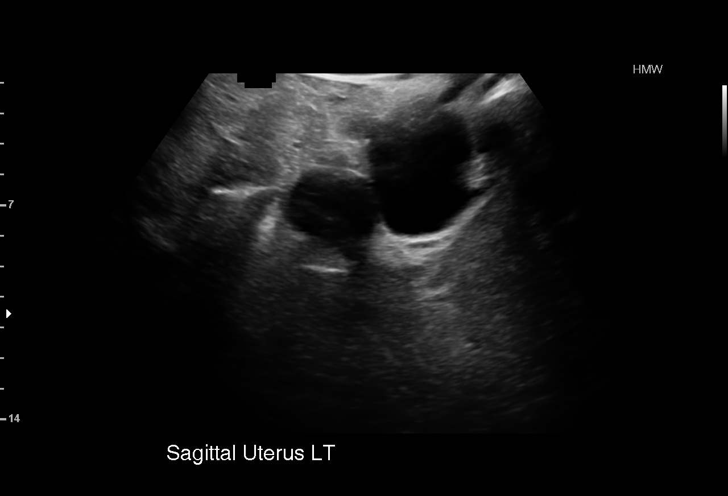
[im 13/56]
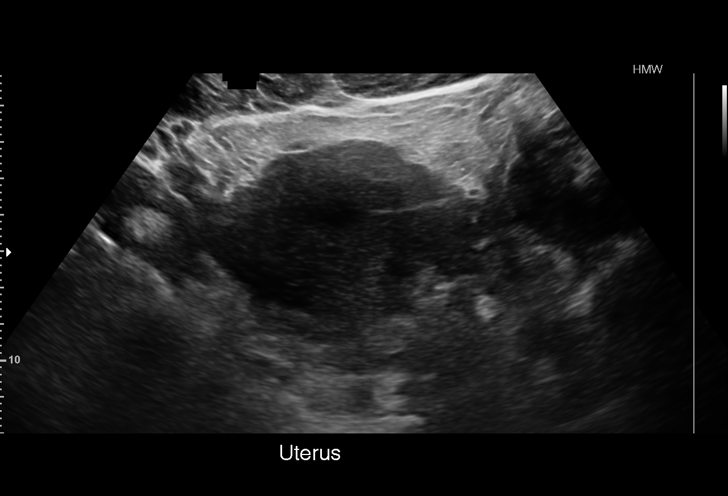
[im 17/56]
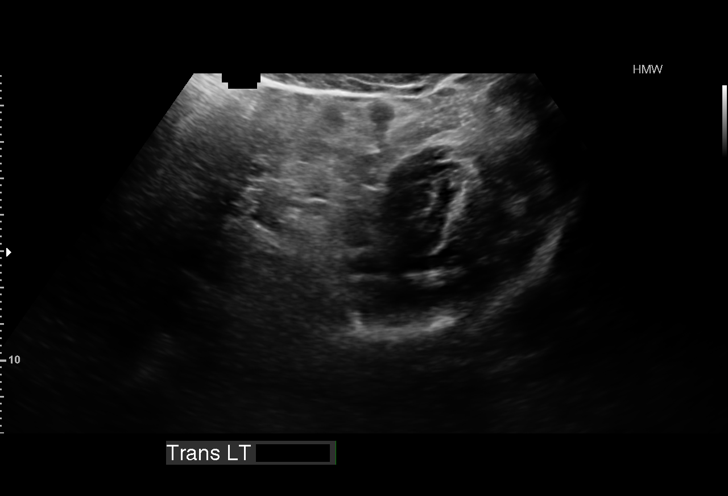
[im 21/56]
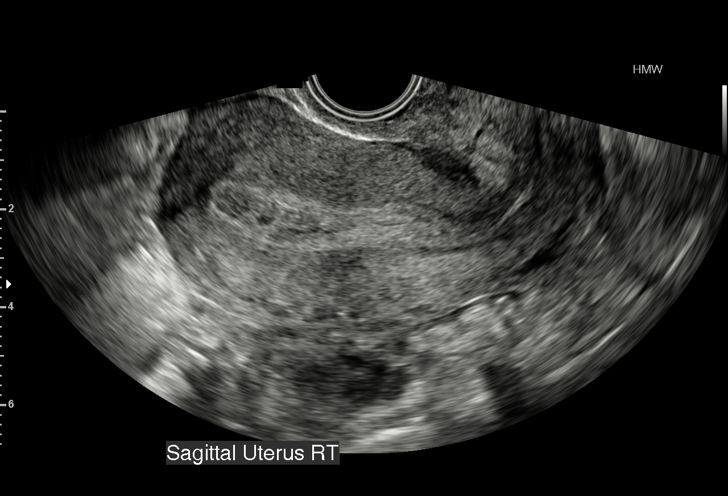
[im 25/56]
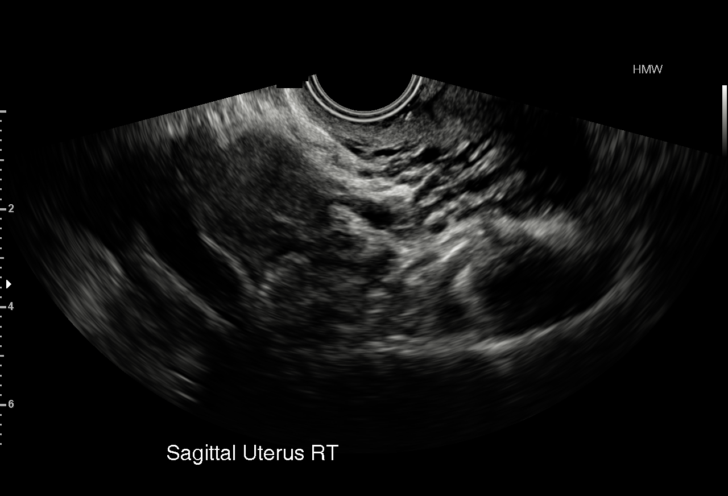
[im 29/56]
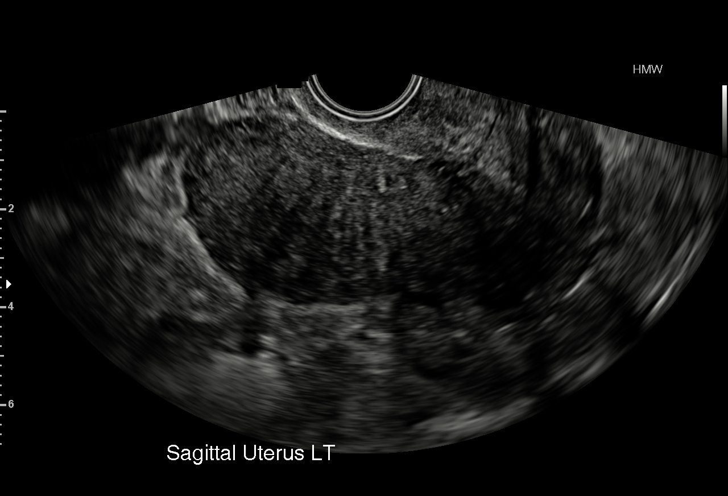
[im 31/56]
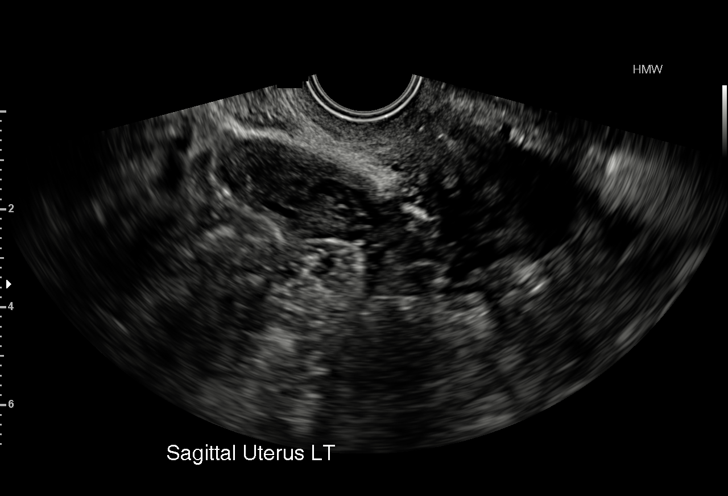
[im 35/56]
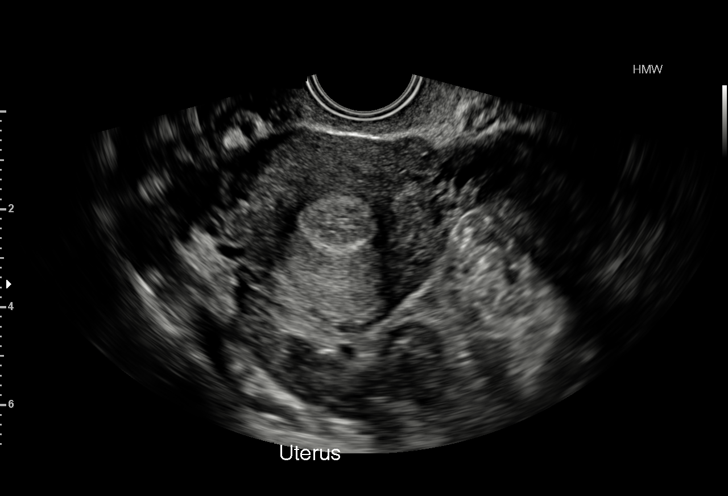
[im 39/56]
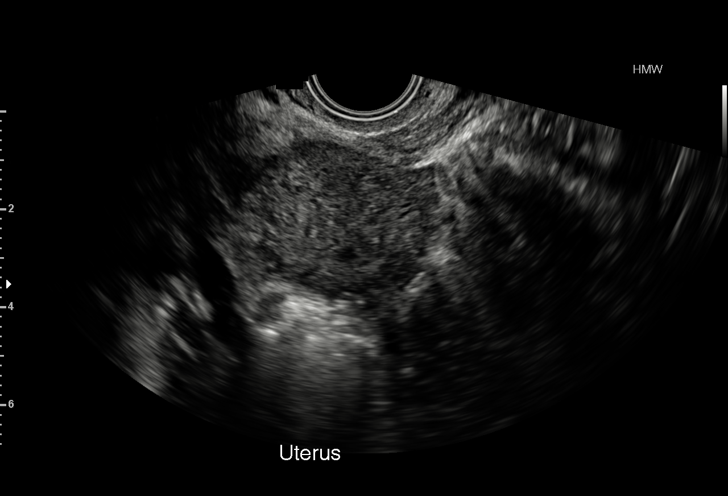
[im 43/56]
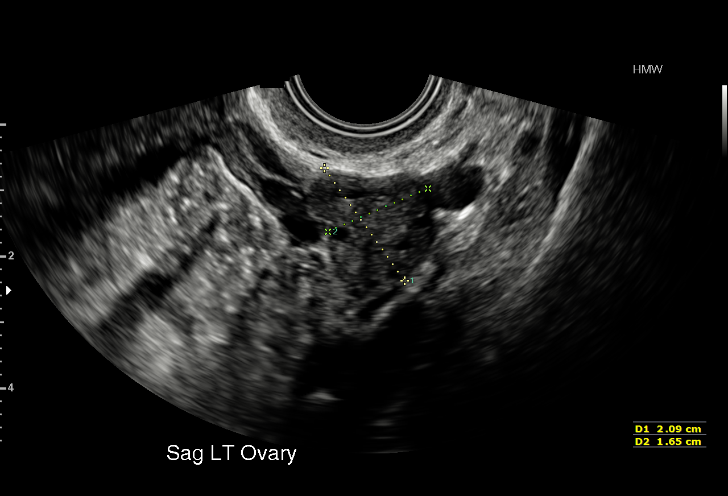
[im 47/56]
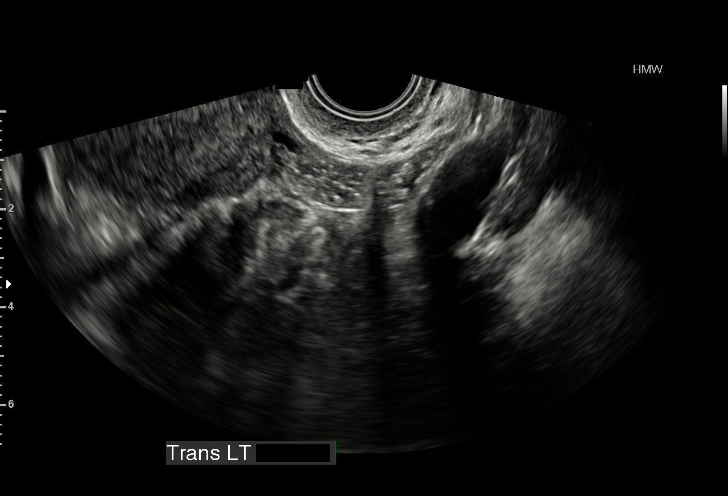
[im 51/56]
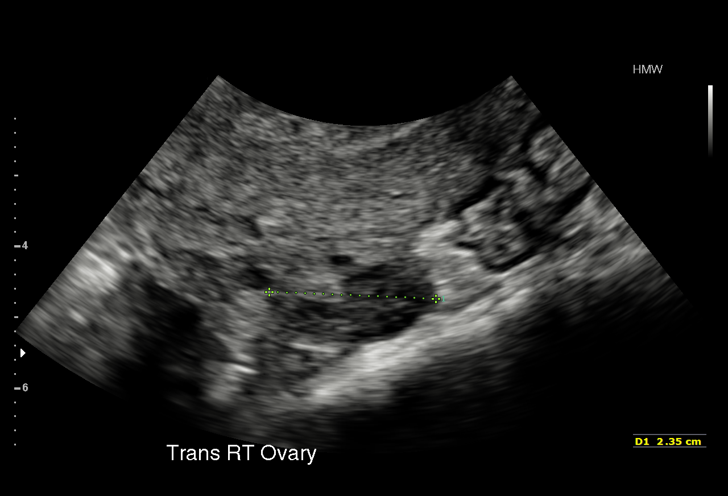
[im 56/56]
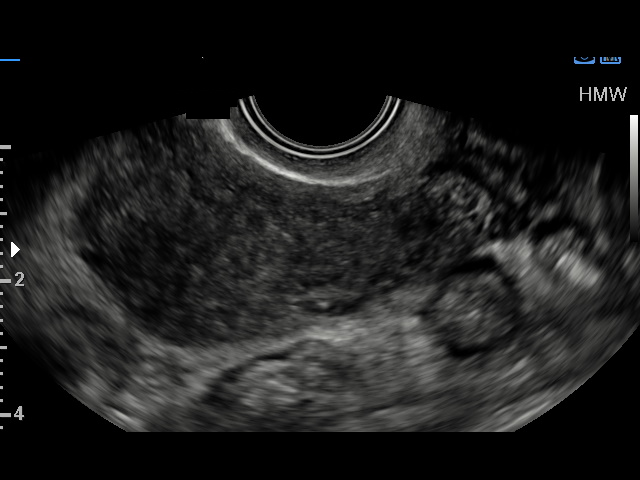

[15 of 28 positions shown; findings below may reference images not displayed]

FINDINGS: Intrauterine gestational sac: Not visualized.

Yolk sac:  Not present.

Embryo:  Not present.

Cardiac Activity: Not press.

Subchorionic hemorrhage:  None visualized.

Maternal uterus/adnexae: Endometrium is slightly thickened and
heterogeneous. Maternal ovaries are unremarkable. No evidence for
adnexal mass.
IMPRESSION: No intrauterine gestation identified and no adnexal masses evident.
Given the history of a positive pregnancy test, differential
considerations include intrauterine gestation too early to
visualize, completed abortion, or nonvisualized ectopic pregnancy.
Close clinical correlation is recommended with serial beta-hCG and
followup ultrasound as warranted.

## 2017-07-20 LAB — OB RESULTS CONSOLE ABO/RH: RH Type: POSITIVE

## 2017-07-20 LAB — OB RESULTS CONSOLE RPR: RPR: NONREACTIVE

## 2017-07-20 LAB — OB RESULTS CONSOLE ANTIBODY SCREEN: ANTIBODY SCREEN: NEGATIVE

## 2017-07-20 LAB — OB RESULTS CONSOLE HEPATITIS B SURFACE ANTIGEN: HEP B S AG: NEGATIVE

## 2017-07-20 LAB — OB RESULTS CONSOLE GC/CHLAMYDIA
Chlamydia: NEGATIVE
Gonorrhea: NEGATIVE

## 2017-07-20 LAB — OB RESULTS CONSOLE RUBELLA ANTIBODY, IGM: RUBELLA: IMMUNE

## 2017-07-20 LAB — OB RESULTS CONSOLE HIV ANTIBODY (ROUTINE TESTING): HIV: NONREACTIVE

## 2017-09-20 NOTE — L&D Delivery Note (Signed)
Delivery Note At 9:29 PM a viable and healthy female was delivered via Vaginal, Spontaneous (Presentation vtx: loa;  ).  APGAR: 8, 9; weight 6 lb 7.9 oz (2945 g).   Placenta status:spontaneous intact not sent , .  Cord:  with the following complications:none .  Cord pH: none  Anesthesia:   Episiotomy: None Lacerations: None Suture Repair: n/a Est. Blood Loss (mL): 100  Mom to postpartum.  Baby to Couplet care / Skin to Skin.  Lyndzee Kliebert A Ammiel Guiney 02/22/2018, 7:25 PM

## 2017-12-26 ENCOUNTER — Inpatient Hospital Stay (HOSPITAL_COMMUNITY)
Admission: AD | Admit: 2017-12-26 | Discharge: 2017-12-26 | Disposition: A | Discharge status: Home / Self Care | Payer: 59 | Source: Ambulatory Visit | Attending: Obstetrics and Gynecology | Admitting: Obstetrics and Gynecology

## 2017-12-26 ENCOUNTER — Encounter (HOSPITAL_COMMUNITY): Payer: Self-pay | Admitting: *Deleted

## 2017-12-26 DIAGNOSIS — K529 Noninfective gastroenteritis and colitis, unspecified: Secondary | ICD-10-CM | POA: Insufficient documentation

## 2017-12-26 DIAGNOSIS — O99613 Diseases of the digestive system complicating pregnancy, third trimester: Secondary | ICD-10-CM | POA: Insufficient documentation

## 2017-12-26 DIAGNOSIS — O212 Late vomiting of pregnancy: Secondary | ICD-10-CM | POA: Diagnosis present

## 2017-12-26 DIAGNOSIS — R109 Unspecified abdominal pain: Secondary | ICD-10-CM | POA: Diagnosis not present

## 2017-12-26 DIAGNOSIS — O26893 Other specified pregnancy related conditions, third trimester: Secondary | ICD-10-CM | POA: Diagnosis not present

## 2017-12-26 DIAGNOSIS — Z3A31 31 weeks gestation of pregnancy: Secondary | ICD-10-CM | POA: Diagnosis not present

## 2017-12-26 MED ORDER — DEXTROSE 5 % IN LACTATED RINGERS IV BOLUS
1000.0000 mL | Freq: Once | INTRAVENOUS | Status: AC
  Administered 2017-12-26: 1000 mL via INTRAVENOUS

## 2017-12-26 MED ORDER — ONDANSETRON 4 MG PO TBDP: 4.0000 mg | ORAL_TABLET | Freq: Three times a day (TID) | ORAL | 0 refills | Status: DC | PRN

## 2017-12-26 MED ORDER — SODIUM CHLORIDE 0.9 % IV SOLN
8.0000 mg | Freq: Once | INTRAVENOUS | Status: AC
  Administered 2017-12-26: 8 mg via INTRAVENOUS
  Filled 2017-12-26: qty 4

## 2017-12-26 MED ORDER — PROMETHAZINE HCL 12.5 MG PO TABS: 12.5000 mg | ORAL_TABLET | Freq: Four times a day (QID) | ORAL | 0 refills | Status: DC | PRN

## 2017-12-26 MED ORDER — LACTATED RINGERS IV BOLUS
1000.0000 mL | Freq: Once | INTRAVENOUS | Status: AC
  Administered 2017-12-26: 1000 mL via INTRAVENOUS

## 2017-12-26 NOTE — Discharge Instructions (Signed)
Food Choices to Help Relieve Diarrhea, Adult When you have diarrhea, the foods you eat and your eating habits are very important. Choosing the right foods and drinks can help:  Relieve diarrhea.  Replace lost fluids and nutrients.  Prevent dehydration.  What general guidelines should I follow? Relieving diarrhea  Choose foods with less than 2 g or .07 oz. of fiber per serving.  Limit fats to less than 8 tsp (38 g or 1.34 oz.) a day.  Avoid the following: ? Foods and beverages sweetened with high-fructose corn syrup, honey, or sugar alcohols such as xylitol, sorbitol, and mannitol. ? Foods that contain a lot of fat or sugar. ? Fried, greasy, or spicy foods. ? High-fiber grains, breads, and cereals. ? Raw fruits and vegetables.  Eat foods that are rich in probiotics. These foods include dairy products such as yogurt and fermented milk products. They help increase healthy bacteria in the stomach and intestines (gastrointestinal tract, or GI tract).  If you have lactose intolerance, avoid dairy products. These may make your diarrhea worse.  Take medicine to help stop diarrhea (antidiarrheal medicine) only as told by your health care provider. Replacing nutrients  Eat small meals or snacks every 3-4 hours.  Eat bland foods, such as white rice, toast, or baked potato, until your diarrhea starts to get better. Gradually reintroduce nutrient-rich foods as tolerated or as told by your health care provider. This includes: ? Well-cooked protein foods. ? Peeled, seeded, and soft-cooked fruits and vegetables. ? Low-fat dairy products.  Take vitamin and mineral supplements as told by your health care provider. Preventing dehydration   Start by sipping water or a special solution to prevent dehydration (oral rehydration solution, ORS). Urine that is clear or pale yellow means that you are getting enough fluid.  Try to drink at least 8-10 cups of fluid each day to help replace lost  fluids.  You may add other liquids in addition to water, such as clear juice or decaffeinated sports drinks, as tolerated or as told by your health care provider.  Avoid drinks with caffeine, such as coffee, tea, or soft drinks.  Avoid alcohol. What foods are recommended? The items listed may not be a complete list. Talk with your health care provider about what dietary choices are best for you. Grains White rice. White, [place], or pita breads (fresh or toasted), including plain rolls, buns, or bagels. White pasta. Saltine, soda, or graham crackers. Pretzels. Low-fiber cereal. Cooked cereals made with water (such as cornmeal, farina, or cream cereals). Plain muffins. Matzo. Melba toast. Zwieback. Vegetables Potatoes (without the skin). Most well-cooked and canned vegetables without skins or seeds. Tender lettuce. Fruits Apple sauce. Fruits canned in juice. Cooked apricots, cherries, grapefruit, peaches, pears, or plums. Fresh bananas and cantaloupe. Meats and other protein foods Baked or boiled chicken. Eggs. Tofu. Fish. Seafood. Smooth nut butters. Ground or well-cooked tender beef, ham, veal, lamb, pork, or poultry. Dairy Plain yogurt, kefir, and unsweetened liquid yogurt. Lactose-free milk, buttermilk, skim milk, or soy milk. Low-fat or nonfat hard cheese. Beverages Water. Low-calorie sports drinks. Fruit juices without pulp. Strained tomato and vegetable juices. Decaffeinated teas. Sugar-free beverages not sweetened with sugar alcohols. Oral rehydration solutions, if approved by your health care provider. Seasoning and other foods Bouillon, broth, or soups made from recommended foods. What foods are not recommended? The items listed may not be a complete list. Talk with your health care provider about what dietary choices are best for you. Grains Whole grain, whole wheat,  bran, or rye breads, rolls, pastas, and crackers. Wild or brown rice. Whole grain or bran cereals. Barley. Oats and  oatmeal. Corn tortillas or taco shells. Granola. Popcorn. Vegetables Raw vegetables. Fried vegetables. Cabbage, broccoli, Brussels sprouts, artichokes, baked beans, beet greens, corn, kale, legumes, peas, sweet potatoes, and yams. Potato skins. Cooked spinach and cabbage. Fruits Dried fruit, including raisins and dates. Raw fruits. Stewed or dried prunes. Canned fruits with syrup. Meat and other protein foods Fried or fatty meats. Deli meats. Chunky nut butters. Nuts and seeds. Beans and lentils. Tomasa BlaseBacon. Hot dogs. Sausage. Dairy High-fat cheeses. Whole milk, chocolate milk, and beverages made with milk, such as milk shakes. Half-and-half. Cream. sour cream. Ice cream. Beverages Caffeinated beverages (such as coffee, tea, soda, or energy drinks). Alcoholic beverages. Fruit juices with pulp. Prune juice. Soft drinks sweetened with high-fructose corn syrup or sugar alcohols. High-calorie sports drinks. Fats and oils Butter. Cream sauces. Margarine. Salad oils. Plain salad dressings. Olives. Avocados. Mayonnaise. Sweets and desserts Sweet rolls, doughnuts, and sweet breads. Sugar-free desserts sweetened with sugar alcohols such as xylitol and sorbitol. Seasoning and other foods Honey. Hot sauce. Chili powder. Gravy. Cream-based or milk-based soups. Pancakes and waffles. Summary  When you have diarrhea, the foods you eat and your eating habits are very important.  Make sure you get at least 8-10 cups of fluid each day, or enough to keep your urine clear or pale yellow.  Eat bland foods and gradually reintroduce healthy, nutrient-rich foods as tolerated, or as told by your health care provider.  Avoid high-fiber, fried, greasy, or spicy foods. This information is not intended to replace advice given to you by your health care provider. Make sure you discuss any questions you have with your health care provider. Document Released: 11/27/2003 Document Revised: 09/03/2016 Document Reviewed:  09/03/2016 Elsevier Interactive Patient Education  2018 [employer] Norovirus Infection A norovirus infection is caused by exposure to a virus in a group of similar viruses (noroviruses). This type of infection causes inflammation in your stomach and intestines (gastroenteritis). Norovirus is the most common cause of gastroenteritis. It also causes food poisoning. Anyone can get a norovirus infection. It spreads very easily (contagious). You can get it from contaminated food, water, surfaces, or other people. Norovirus is found in the stool or vomit of infected people. You can spread the infection as soon as you feel sick until 2 weeks after you recover. Symptoms usually begin within 2 days after you become infected. Most norovirus symptoms affect the digestive system. What are the causes? Norovirus infection is caused by contact with norovirus. You can catch norovirus if you:  Eat or drink something contaminated with norovirus.  Touch surfaces or objects contaminated with norovirus and then put your hand in your mouth.  Have direct contact with an infected person who has symptoms.  Share food, drink, or utensils with someone with who is sick with norovirus.  What are the signs or symptoms? Symptoms of norovirus may include:  Nausea.  Vomiting.  Diarrhea.  Stomach cramps.  Fever.  Chills.  Headache.  Muscle aches.  Tiredness.  How is this diagnosed? Your health care provider may suspect norovirus based on your symptoms and physical exam. Your health care provider may also test a sample of your stool or vomit for the virus. How is this treated? There is no specific treatment for norovirus. Most people get better without treatment in about 2 days. Follow these instructions at home:  Replace lost fluids by drinking plenty of  water or rehydration fluids containing important minerals called electrolytes. This prevents dehydration. Drink enough fluid to keep your urine clear  or pale yellow.  Do not prepare food for others while you are infected. Wait at least 3 days after recovering from the illness to do that. How is this prevented?  Wash your hands often, especially after using the toilet or changing a diaper.  Wash fruits and vegetables thoroughly before preparing or serving them.  Throw out any food that a sick person may have touched.  Disinfect contaminated surfaces immediately after someone in the household has been sick. Use a bleach-based household cleaner.  Immediately remove and wash soiled clothes or sheets. Contact a health care provider if:  Your vomiting, diarrhea, and stomach pain is getting worse.  Your symptoms of norovirus do not go away after 2-3 days. Get help right away if: You develop symptoms of dehydration that do not improve with fluid replacement. This may include:  Excessive sleepiness.  Lack of tears.  Dry mouth.  Dizziness when standing.  Weak pulse.  This information is not intended to replace advice given to you by your health care provider. Make sure you discuss any questions you have with your health care provider. Document Released: 11/27/2002 Document Revised: 02/12/2016 Document Reviewed: 02/14/2014 Elsevier Interactive Patient Education  2017 [employer].

## 2017-12-26 NOTE — MAU Provider Note (Signed)
History     CSN: [id number]  Arrival date and time: 12/26/17 1231   First Provider Initiated Contact with Patient 12/26/17 1410      Chief Complaint  Patient presents with  . Emesis  . Diarrhea  . Abdominal Pain   HPI   [name] is a 27 y.o. female [phone] @ 4468w3d here in MAU with complaints of N/V/D. Says symptoms started last night at 0200. Says her child had the same symptoms as her, and another family member had the symptoms 1 week ago. No fever. + fetal movement.   OB History    Gravida  6   Para  2   Term  2   Preterm      AB  3   Living  2     SAB  1   TAB  2   Ectopic      Multiple  0   Live Births  2           Past Medical History:  Diagnosis Date  . Asthma    as  child  . Depression    day by day, in therapy  . Medical history non-contributory   . Vaginal Pap smear, abnormal    f/u was normal    Past Surgical History:  Procedure Laterality Date  . THERAPEUTIC ABORTION      Family History  Problem Relation Age of Onset  . Heart disease Mother   . Heart disease Maternal Grandmother   . Hyperlipidemia Maternal Grandmother   . Hypertension Maternal Grandmother   . Heart disease Maternal Grandfather   . Mental illness Paternal Grandmother   . Heart disease Paternal Grandmother     Social History   Tobacco Use  . Smoking status: Never Smoker  . Smokeless tobacco: Never Used  Substance Use Topics  . Alcohol use: Not Currently  . Drug use: No    Allergies:  Allergies  Allergen Reactions  . Zantac [Ranitidine Hcl] Hives    Medications Prior to Admission  Medication Sig Dispense Refill Last Dose  . doxylamine, Sleep, (UNISOM) 25 MG tablet Take 12.5 mg by mouth at bedtime as needed.   12/26/2017 at Unknown time  . hydrocortisone cream 1 % Apply 1 application topically 2 (two) times daily.   12/26/2017 at Unknown time  . pantoprazole (PROTONIX) 40 MG tablet Take 40 mg by mouth 2 (two) times daily.   12/26/2017 at Unknown  time  . Prenatal Vit-Fe Fumarate-FA (PRENATAL MULTIVITAMIN) TABS tablet Take 1 tablet by mouth at bedtime.   12/26/2017 at Unknown time   No results found for this or any previous visit (from the past 48 hour(s)).  Review of Systems  Constitutional: Negative for fever.  Gastrointestinal: Positive for diarrhea, nausea and vomiting. Negative for abdominal pain.  Genitourinary: Negative for vaginal bleeding.   Physical Exam   Blood pressure 117/72, pulse 85, temperature 98.3 F (36.8 C), temperature source Oral, resp. rate 17, weight 193 lb 12 oz (87.9 kg), last menstrual period 05/11/2017, SpO2 98 %, unknown if currently breastfeeding.  Physical Exam  Constitutional: She is oriented to person, place, and time. She appears well-developed and well-nourished. No distress.  HENT:  Head: Normocephalic.  Eyes: Pupils are equal, round, and reactive to light.  Neck: Neck supple.  Musculoskeletal: Normal range of motion.  Neurological: She is alert and oriented to person, place, and time.  Skin: Skin is warm. She is not diaphoretic.  Psychiatric: Her behavior is normal.  Fetal Tracing: Baseline: 130 bpm Variability: Moderate  Accelerations: 15x15 Decelerations: None Toco: None  MAU Course  Procedures  None  MDM  LR bolus D5L Bolus Zofran 8 mg IV Patient tolerating oral fluids in MAU  Assessment and Plan   A:  1. Gastroenteritis, acute     P:  Discharge home in stable condition Rx: Zofran, Phenergan BRAT diet Clear fluids Return to MAU as needed, if symptoms worsen  Taesha Goodell, Harolyn Rutherford, NP 12/27/2017 2:12 PM

## 2017-12-26 NOTE — MAU Note (Signed)
Pt c/o n/v/d since 0200 this AM.  States 5 episodes of diarrhea today.  Also c/o abdominal cramping.  Denies vaginal bleeding or LOF.

## 2018-01-13 ENCOUNTER — Other Ambulatory Visit: Payer: Self-pay | Admitting: Obstetrics and Gynecology

## 2018-01-15 ENCOUNTER — Inpatient Hospital Stay (HOSPITAL_COMMUNITY)
Admission: AD | Admit: 2018-01-15 | Discharge: 2018-01-15 | Disposition: A | Discharge status: Home / Self Care | Payer: 59 | Source: Ambulatory Visit | Attending: Obstetrics and Gynecology | Admitting: Obstetrics and Gynecology

## 2018-01-15 DIAGNOSIS — O26899 Other specified pregnancy related conditions, unspecified trimester: Secondary | ICD-10-CM | POA: Diagnosis not present

## 2018-01-15 DIAGNOSIS — Z3A00 Weeks of gestation of pregnancy not specified: Secondary | ICD-10-CM | POA: Insufficient documentation

## 2018-01-15 DIAGNOSIS — L299 Pruritus, unspecified: Secondary | ICD-10-CM | POA: Insufficient documentation

## 2018-01-15 NOTE — MAU Note (Signed)
Pt c/o itching all over for the past month. Dr Cherly Hensen ordered pt to come in for fasting blood work. Pt denies any vag bleeding or leaking and reports good fetal movement.

## 2018-01-16 LAB — BILE ACIDS, TOTAL: BILE ACIDS TOTAL: 8.7 umol/L (ref 4.7–24.5)

## 2018-01-18 ENCOUNTER — Other Ambulatory Visit: Payer: Self-pay | Admitting: Obstetrics and Gynecology

## 2018-01-26 LAB — OB RESULTS CONSOLE GBS: GBS: NEGATIVE

## 2018-02-06 ENCOUNTER — Telehealth (HOSPITAL_COMMUNITY): Payer: Self-pay | Admitting: *Deleted

## 2018-02-06 ENCOUNTER — Encounter (HOSPITAL_COMMUNITY): Payer: Self-pay | Admitting: *Deleted

## 2018-02-06 NOTE — Telephone Encounter (Signed)
Preadmission screen  

## 2018-02-07 ENCOUNTER — Other Ambulatory Visit: Payer: Self-pay | Admitting: Obstetrics and Gynecology

## 2018-02-17 ENCOUNTER — Other Ambulatory Visit: Payer: Self-pay

## 2018-02-17 ENCOUNTER — Inpatient Hospital Stay (HOSPITAL_COMMUNITY): Payer: 59 | Admitting: Anesthesiology

## 2018-02-17 ENCOUNTER — Encounter (HOSPITAL_COMMUNITY): Payer: Self-pay

## 2018-02-17 ENCOUNTER — Inpatient Hospital Stay (HOSPITAL_COMMUNITY)
Admission: RE | Admit: 2018-02-17 | Discharge: 2018-02-19 | DRG: 807 | Disposition: A | Discharge status: Home / Self Care | Payer: 59 | Attending: Obstetrics and Gynecology | Admitting: Obstetrics and Gynecology

## 2018-02-17 DIAGNOSIS — Z3A39 39 weeks gestation of pregnancy: Secondary | ICD-10-CM

## 2018-02-17 DIAGNOSIS — Z8659 Personal history of other mental and behavioral disorders: Secondary | ICD-10-CM

## 2018-02-17 DIAGNOSIS — Z3483 Encounter for supervision of other normal pregnancy, third trimester: Secondary | ICD-10-CM | POA: Diagnosis present

## 2018-02-17 DIAGNOSIS — Z8759 Personal history of other complications of pregnancy, childbirth and the puerperium: Secondary | ICD-10-CM

## 2018-02-17 DIAGNOSIS — Z3490 Encounter for supervision of normal pregnancy, unspecified, unspecified trimester: Secondary | ICD-10-CM | POA: Diagnosis present

## 2018-02-17 LAB — CBC
HEMATOCRIT: 36.5 % (ref 36.0–46.0)
HEMOGLOBIN: 12 g/dL (ref 12.0–15.0)
MCH: 30.2 pg (ref 26.0–34.0)
MCHC: 32.9 g/dL (ref 30.0–36.0)
MCV: 91.7 fL (ref 78.0–100.0)
Platelets: 494 10*3/uL — ABNORMAL HIGH (ref 150–400)
RBC: 3.98 MIL/uL (ref 3.87–5.11)
RDW: 13.7 % (ref 11.5–15.5)
WBC: 7.2 10*3/uL (ref 4.0–10.5)

## 2018-02-17 LAB — TYPE AND SCREEN
ABO/RH(D): O POS
ANTIBODY SCREEN: NEGATIVE

## 2018-02-17 LAB — RPR: RPR: NONREACTIVE

## 2018-02-17 MED ORDER — ONDANSETRON HCL 4 MG/2ML IJ SOLN: 4.0000 mg | Freq: Four times a day (QID) | INTRAMUSCULAR | Status: DC | PRN

## 2018-02-17 MED ORDER — LACTATED RINGERS IV SOLN
INTRAVENOUS | Status: DC
  Administered 2018-02-17 (×2): via INTRAVENOUS

## 2018-02-17 MED ORDER — COCONUT OIL OIL: 1.0000 "application " | TOPICAL_OIL | Status: DC | PRN

## 2018-02-17 MED ORDER — ACETAMINOPHEN 325 MG PO TABS
650.0000 mg | ORAL_TABLET | ORAL | Status: DC | PRN
  Administered 2018-02-18 (×3): 650 mg via ORAL
  Filled 2018-02-17 (×3): qty 2

## 2018-02-17 MED ORDER — ACETAMINOPHEN 325 MG PO TABS: 650.0000 mg | ORAL_TABLET | ORAL | Status: DC | PRN

## 2018-02-17 MED ORDER — LACTATED RINGERS IV SOLN
INTRAVENOUS | Status: DC
  Administered 2018-02-17: 20:00:00 via INTRAVENOUS

## 2018-02-17 MED ORDER — FENTANYL CITRATE (PF) 100 MCG/2ML IJ SOLN: 100.0000 ug | Freq: Once | INTRAMUSCULAR | Status: DC

## 2018-02-17 MED ORDER — ZOLPIDEM TARTRATE 5 MG PO TABS: 5.0000 mg | ORAL_TABLET | Freq: Every evening | ORAL | Status: DC | PRN

## 2018-02-17 MED ORDER — IBUPROFEN 600 MG PO TABS
600.0000 mg | ORAL_TABLET | Freq: Four times a day (QID) | ORAL | Status: DC
  Administered 2018-02-18 – 2018-02-19 (×7): 600 mg via ORAL
  Filled 2018-02-17 (×7): qty 1

## 2018-02-17 MED ORDER — SENNOSIDES-DOCUSATE SODIUM 8.6-50 MG PO TABS
2.0000 | ORAL_TABLET | ORAL | Status: DC
  Administered 2018-02-18 (×2): 2 via ORAL
  Filled 2018-02-17 (×2): qty 2

## 2018-02-17 MED ORDER — DIBUCAINE 1 % RE OINT: 1.0000 "application " | TOPICAL_OINTMENT | RECTAL | Status: DC | PRN

## 2018-02-17 MED ORDER — LACTATED RINGERS IV SOLN: 500.0000 mL | Freq: Once | INTRAVENOUS | Status: DC

## 2018-02-17 MED ORDER — LACTATED RINGERS IV SOLN: 500.0000 mL | INTRAVENOUS | Status: DC | PRN

## 2018-02-17 MED ORDER — LIDOCAINE HCL (PF) 1 % IJ SOLN
INTRAMUSCULAR | Status: DC | PRN
  Administered 2018-02-17: 4 mL via EPIDURAL

## 2018-02-17 MED ORDER — PHENYLEPHRINE 40 MCG/ML (10ML) SYRINGE FOR IV PUSH (FOR BLOOD PRESSURE SUPPORT)
80.0000 ug | PREFILLED_SYRINGE | INTRAVENOUS | Status: DC | PRN
  Filled 2018-02-17: qty 5

## 2018-02-17 MED ORDER — DIPHENHYDRAMINE HCL 50 MG/ML IJ SOLN: 12.5000 mg | INTRAMUSCULAR | Status: DC | PRN

## 2018-02-17 MED ORDER — ONDANSETRON HCL 4 MG PO TABS: 4.0000 mg | ORAL_TABLET | ORAL | Status: DC | PRN

## 2018-02-17 MED ORDER — ONDANSETRON HCL 4 MG/2ML IJ SOLN: 4.0000 mg | INTRAMUSCULAR | Status: DC | PRN

## 2018-02-17 MED ORDER — PHENYLEPHRINE 40 MCG/ML (10ML) SYRINGE FOR IV PUSH (FOR BLOOD PRESSURE SUPPORT): 80.0000 ug | PREFILLED_SYRINGE | INTRAVENOUS | Status: DC | PRN

## 2018-02-17 MED ORDER — EPHEDRINE 5 MG/ML INJ
10.0000 mg | INTRAVENOUS | Status: DC | PRN
  Filled 2018-02-17: qty 2

## 2018-02-17 MED ORDER — OXYCODONE HCL 5 MG PO TABS: 10.0000 mg | ORAL_TABLET | ORAL | Status: DC | PRN

## 2018-02-17 MED ORDER — BENZOCAINE-MENTHOL 20-0.5 % EX AERO
1.0000 "application " | INHALATION_SPRAY | CUTANEOUS | Status: DC | PRN
  Administered 2018-02-18: 1 via TOPICAL
  Filled 2018-02-17: qty 56

## 2018-02-17 MED ORDER — FENTANYL 2.5 MCG/ML BUPIVACAINE 1/10 % EPIDURAL INFUSION (WH - ANES)
14.0000 mL/h | INTRAMUSCULAR | Status: DC | PRN
  Administered 2018-02-17 (×3): 14 mL/h via EPIDURAL
  Filled 2018-02-17 (×2): qty 100

## 2018-02-17 MED ORDER — TERBUTALINE SULFATE 1 MG/ML IJ SOLN
0.2500 mg | Freq: Once | INTRAMUSCULAR | Status: DC | PRN
  Filled 2018-02-17: qty 1

## 2018-02-17 MED ORDER — EPHEDRINE 5 MG/ML INJ: 10.0000 mg | INTRAVENOUS | Status: DC | PRN

## 2018-02-17 MED ORDER — OXYTOCIN 40 UNITS IN LACTATED RINGERS INFUSION - SIMPLE MED
1.0000 m[IU]/min | INTRAVENOUS | Status: DC
  Administered 2018-02-17: 2 m[IU]/min via INTRAVENOUS
  Filled 2018-02-17: qty 1000

## 2018-02-17 MED ORDER — DIPHENHYDRAMINE HCL 25 MG PO CAPS: 25.0000 mg | ORAL_CAPSULE | Freq: Four times a day (QID) | ORAL | Status: DC | PRN

## 2018-02-17 MED ORDER — WITCH HAZEL-GLYCERIN EX PADS: 1.0000 "application " | MEDICATED_PAD | CUTANEOUS | Status: DC | PRN

## 2018-02-17 MED ORDER — OXYCODONE HCL 5 MG PO TABS
5.0000 mg | ORAL_TABLET | ORAL | Status: DC | PRN
  Administered 2018-02-18: 5 mg via ORAL
  Filled 2018-02-17: qty 1

## 2018-02-17 MED ORDER — SOD CITRATE-CITRIC ACID 500-334 MG/5ML PO SOLN: 30.0000 mL | ORAL | Status: DC | PRN

## 2018-02-17 MED ORDER — SIMETHICONE 80 MG PO CHEW: 80.0000 mg | CHEWABLE_TABLET | ORAL | Status: DC | PRN

## 2018-02-17 MED ORDER — FERROUS SULFATE 325 (65 FE) MG PO TABS
325.0000 mg | ORAL_TABLET | Freq: Two times a day (BID) | ORAL | Status: DC
  Administered 2018-02-18: 325 mg via ORAL
  Filled 2018-02-17: qty 1

## 2018-02-17 MED ORDER — PHENYLEPHRINE 40 MCG/ML (10ML) SYRINGE FOR IV PUSH (FOR BLOOD PRESSURE SUPPORT)
80.0000 ug | PREFILLED_SYRINGE | INTRAVENOUS | Status: DC | PRN
  Filled 2018-02-17: qty 5
  Filled 2018-02-17: qty 10

## 2018-02-17 MED ORDER — OXYTOCIN 40 UNITS IN LACTATED RINGERS INFUSION - SIMPLE MED: 1.0000 m[IU]/min | INTRAVENOUS | Status: DC

## 2018-02-17 MED ORDER — PRENATAL MULTIVITAMIN CH
1.0000 | ORAL_TABLET | Freq: Every day | ORAL | Status: DC
  Administered 2018-02-18 – 2018-02-19 (×2): 1 via ORAL
  Filled 2018-02-17 (×2): qty 1

## 2018-02-17 NOTE — H&P (Signed)
Beth Carey is a 27 y.o. female presenting for IOL 2nd to multiparity @ term OB History    Gravida  6   Para  2   Term  2   Preterm      AB  3   Living  2     SAB  1   TAB  2   Ectopic      Multiple  0   Live Births  2          Past Medical History:  Diagnosis Date  . Asthma    as  child  . Depression    day by day, in therapy  . Medical history non-contributory   . Vaginal Pap smear, abnormal    f/u was normal   Past Surgical History:  Procedure Laterality Date  . COLPOSCOPY    . THERAPEUTIC ABORTION     Family History: family history includes Heart disease in her maternal grandfather, maternal grandmother, mother, and paternal grandmother; Hyperlipidemia in her maternal grandmother; Hypertension in her maternal grandmother; Mental illness in her paternal grandmother. Social History:  reports that she has never smoked. She has never used smokeless tobacco. She reports that she drank alcohol. She reports that she does not use drugs.     Maternal Diabetes: No Genetic Screening: Normal Maternal Ultrasounds/Referrals: Normal Fetal Ultrasounds or other Referrals:  None Maternal Substance Abuse:  No Significant Maternal Medications:  None Significant Maternal Lab Results:  Lab values include: Group B Strep negative Other Comments:  None  Review of Systems  All other systems reviewed and are negative.  History Dilation: 3.5 Effacement (%): 60 Station: -3 Exam by:: Dr. Cherly Hensenousins Blood pressure 109/73, pulse 88, temperature 98.3 F (36.8 C), temperature source Oral, resp. rate 20, height 5\' 3"  (1.6 m), weight 89.1 kg (196 lb 6 oz), last menstrual period 05/11/2017, SpO2 100 %, unknown if currently breastfeeding. Exam Physical Exam  Constitutional: She is oriented to person, place, and time. She appears well-developed and well-nourished.  HENT:  Head: Atraumatic.  Eyes: EOM are normal.  Neck: Neck supple.  Cardiovascular: Regular rhythm.   Respiratory: Breath sounds normal.  GI: Soft.  Musculoskeletal: She exhibits edema.  Neurological: She is alert and oriented to person, place, and time.  Skin: Skin is warm and dry.  Psychiatric: She has a normal mood and affect.    Prenatal labs: ABO, Rh: --/--/O POS (05/31 0757) Antibody: NEG (05/31 0757) Rubella: Immune (10/31 0000) RPR: Non Reactive (05/31 0757)  HBsAg: Negative (10/31 0000)  HIV: Non-reactive (10/31 0000)  GBS: Negative (05/09 0000)   Assessment/Plan: Term gestation Multiparity P) admit routine labs. Pitocin induction. Amniotomy prn. epidurla   Beth Carey 02/17/2018, 1:45 PM

## 2018-02-17 NOTE — Anesthesia Procedure Notes (Signed)
Epidural Patient location during procedure: OB Start time: 02/17/2018 1:13 PM End time: 02/17/2018 1:19 PM  Staffing Anesthesiologist: Shelton SilvasHollis, Klaryssa Fauth D, MD Performed: anesthesiologist   Preanesthetic Checklist Completed: patient identified, site marked, surgical consent, pre-op evaluation, timeout performed, IV checked, risks and benefits discussed and monitors and equipment checked  Epidural Patient position: sitting Prep: ChloraPrep Patient monitoring: heart rate, continuous pulse ox and blood pressure Approach: midline Location: L3-L4 Injection technique: LOR saline  Needle:  Needle type: Tuohy  Needle gauge: 17 G Needle length: 9 cm Catheter type: closed end flexible Catheter size: 20 Guage Test dose: negative and 1.5% lidocaine  Assessment Events: blood not aspirated, injection not painful, no injection resistance and no paresthesia  Additional Notes LOR @ 4.5  Patient identified. Risks/Benefits/Options discussed with patient including but not limited to bleeding, infection, nerve damage, paralysis, failed block, incomplete pain control, headache, blood pressure changes, nausea, vomiting, reactions to medications, itching and postpartum back pain. Confirmed with bedside nurse the patient's most recent platelet count. Confirmed with patient that they are not currently taking any anticoagulation, have any bleeding history or any family history of bleeding disorders. Patient expressed understanding and wished to proceed. All questions were answered. Sterile technique was used throughout the entire procedure. Please see nursing notes for vital signs. Test dose was given through epidural catheter and negative prior to continuing to dose epidural or start infusion. Warning signs of high block given to the patient including shortness of breath, tingling/numbness in hands, complete motor block, or any concerning symptoms with instructions to call for help. Patient was given instructions  on fall risk and not to get out of bed. All questions and concerns addressed with instructions to call with any issues or inadequate analgesia.    Reason for block:procedure for pain

## 2018-02-17 NOTE — Anesthesia Pain Management Evaluation Note (Signed)
  CRNA Pain Management Visit Note  Patient: Beth Carey, 27 y.o., female  "Hello I am a member of the anesthesia team at [facility]. We have an anesthesia team available at all times to provide care throughout the hospital, including epidural management and anesthesia for C-section. I don't know your plan for the delivery whether it a natural birth, water birth, IV sedation, nitrous supplementation, doula or epidural, but we want to meet your pain goals."   1.Was your pain managed to your expectations on prior hospitalizations?   Yes   2.What is your expectation for pain management during this hospitalization?     Epidural  3.How can we help you reach that goal? Epidrual  Record the patient's initial score and the patient's pain goal.   Pain: 0  Pain Goal: 7 [facility] wants you to be able to say your pain was always managed very well.  Rica Records 02/17/2018

## 2018-02-17 NOTE — Progress Notes (Signed)
Amada JupiterBrittany Haug is a 27 y.o. [id number] at 3644w0d by LMP admitted for induction of labor due to Elective at term.  Subjective: No chief complaint on file. S: comfortable Epidural pitocin  Objective: BP (!) 97/54   Pulse 88   Temp 98.2 F (36.8 C) (Oral)   Resp 20   Ht 5\' 3"  (1.6 m)   Wt 89.1 kg (196 lb 6 oz)   LMP 05/11/2017   SpO2 100%   BMI 34.79 kg/m  No intake/output data recorded. No intake/output data recorded. VE 5cm/patulous/-2 asynclitic AROM clear fluid IUPC placed Tracing baseline 140 (+) accel   occ variable Ctx q 2-3 mins Labs: Lab Results  Component Value Date   WBC 7.2 02/17/2018   HGB 12.0 02/17/2018   HCT 36.5 02/17/2018   MCV 91.7 02/17/2018   PLT 494 (H) 02/17/2018    Assessment / Plan: active phase  Term gestation P) left  exaggerated sims. Cont pitocin   Anticipated MOD:  NSVD  Aisea Bouldin A Totiana Everson 02/17/2018, 7:34 PM

## 2018-02-17 NOTE — Anesthesia Preprocedure Evaluation (Signed)
Anesthesia Evaluation  Patient identified by MRN, date of birth, ID band Patient awake    Reviewed: Allergy & Precautions, Patient's Chart, lab work & pertinent test results  Airway Mallampati: II       Dental no notable dental hx.    Pulmonary asthma ,    Pulmonary exam normal        Cardiovascular negative cardio ROS Normal cardiovascular exam     Neuro/Psych PSYCHIATRIC DISORDERS Depression    GI/Hepatic negative GI ROS, Neg liver ROS,   Endo/Other  negative endocrine ROS  Renal/GU negative Renal ROS     Musculoskeletal   Abdominal   Peds  Hematology negative hematology ROS (+)   Anesthesia Other Findings   Reproductive/Obstetrics (+) Pregnancy                             Anesthesia Physical Anesthesia Plan  ASA: II  Anesthesia Plan: Epidural   Post-op Pain Management:    Induction:   PONV Risk Score and Plan:   Airway Management Planned: Natural Airway  Additional Equipment:   Intra-op Plan:   Post-operative Plan:   Informed Consent: I have reviewed the patients History and Physical, chart, labs and discussed the procedure including the risks, benefits and alternatives for the proposed anesthesia with the patient or authorized representative who has indicated his/her understanding and acceptance.     Plan Discussed with:   Anesthesia Plan Comments: (Lab Results      Component                Value               Date                      WBC                      7.2                 02/17/2018                HGB                      12.0                02/17/2018                HCT                      36.5                02/17/2018                MCV                      91.7                02/17/2018                PLT                      494 (H)             02/17/2018           )        Anesthesia Quick Evaluation

## 2018-02-18 ENCOUNTER — Encounter (HOSPITAL_COMMUNITY): Payer: Self-pay

## 2018-02-18 LAB — CBC
HEMATOCRIT: 33.2 % — AB (ref 36.0–46.0)
Hemoglobin: 11 g/dL — ABNORMAL LOW (ref 12.0–15.0)
MCH: 30.2 pg (ref 26.0–34.0)
MCHC: 33.1 g/dL (ref 30.0–36.0)
MCV: 91.2 fL (ref 78.0–100.0)
Platelets: 451 10*3/uL — ABNORMAL HIGH (ref 150–400)
RBC: 3.64 MIL/uL — ABNORMAL LOW (ref 3.87–5.11)
RDW: 13.6 % (ref 11.5–15.5)
WBC: 12 10*3/uL — ABNORMAL HIGH (ref 4.0–10.5)

## 2018-02-18 NOTE — Anesthesia Postprocedure Evaluation (Signed)
Anesthesia Post Note  Patient: Beth Carey  Procedure(s) Performed: AN AD HOC LABOR EPIDURAL     Patient location during evaluation: Mother Baby Anesthesia Type: Epidural Level of consciousness: awake and alert and oriented Pain management: satisfactory to patient Vital Signs Assessment: post-procedure vital signs reviewed and stable Respiratory status: respiratory function stable Cardiovascular status: stable Postop Assessment: no headache, no backache, epidural receding, patient able to bend at knees, no signs of nausea or vomiting and adequate PO intake Anesthetic complications: no    Last Vitals:  Vitals:   02/18/18 0011 02/18/18 0409  BP: (!) 105/54 103/66  Pulse: 79 75  Resp: 18 18  Temp: 36.9 C 36.6 C  SpO2: 98% 99%    Last Pain:  Vitals:   02/18/18 0809  TempSrc:   PainSc: 4    Pain Goal: Patients Stated Pain Goal: 0 (02/18/18 0809)               Karleen DolphinFUSSELL,Delonta Yohannes

## 2018-02-18 NOTE — Lactation Note (Signed)
This note was copied from a baby's chart. Lactation Consultation Note Baby 3 hrs old. Mom isn't going to put the baby to the breast anymore. Just going to pump and bottle feed. Mom has DEBP Medela at home. Mom is giving formula until milk comes in.  Discussed importance of supply and demand. Mom shown how to use DEBP & how to disassemble, clean, & reassemble parts. Mom knows to pump q3h for 15-20 min. Encouraged hand expression after pumping. Mom encouraged to feed baby 8-12 times/24 hours and with feeding cues. Newborn feeding habits, STS, I&O, discussed. Encouraged to call for assistance.  [facility]/LC brochure given w/resources, support groups and LC services.  Patient Name: Beth Amada JupiterBrittany Horne Today's Date: 02/18/2018 Reason for consult: Initial assessment   Maternal Data Has patient been taught Hand Expression?: Yes Does the patient have breastfeeding experience prior to this delivery?: Yes  Feeding Feeding Type: Bottle Fed - Formula Nipple Type: Slow - flow Length of feed: 0 min(attempted)  LATCH Score Latch: Too sleepy or reluctant, no latch achieved, no sucking elicited.  Audible Swallowing: None  Type of Nipple: Everted at rest and after stimulation  Comfort (Breast/Nipple): Soft / non-tender  Hold (Positioning): Full assist, staff holds infant at breast  LATCH Score: 4  Interventions Interventions: Breast feeding basics reviewed;Breast massage;DEBP;Breast compression  Lactation Tools Discussed/Used Tools: Pump Breast pump type: Double-Electric Breast Pump WIC Program: Yes Pump Review: Setup, frequency, and cleaning;Milk Storage Initiated by:: Peri JeffersonL. Zaylei Mullane RN IBCLC Date initiated:: 02/18/18   Consult Status Consult Status: Follow-up Date: 02/19/18 Follow-up type: In-patient    Charyl DancerCARVER, Barbette Mcglaun G 02/18/2018, 12:47 AM

## 2018-02-19 DIAGNOSIS — Z8659 Personal history of other mental and behavioral disorders: Secondary | ICD-10-CM

## 2018-02-19 DIAGNOSIS — Z8759 Personal history of other complications of pregnancy, childbirth and the puerperium: Secondary | ICD-10-CM

## 2018-02-19 MED ORDER — POLYETHYLENE GLYCOL 3350 17 G PO PACK
17.0000 g | PACK | Freq: Every day | ORAL | Status: DC
  Administered 2018-02-19: 17 g via ORAL
  Filled 2018-02-19 (×2): qty 1

## 2018-02-19 MED ORDER — IBUPROFEN 600 MG PO TABS: 600.0000 mg | ORAL_TABLET | Freq: Four times a day (QID) | ORAL | 0 refills | Status: DC

## 2018-02-19 NOTE — Discharge Summary (Signed)
Obstetric Discharge Summary   Patient Name: Beth Carey DOB: [birth year] MRN: [id number]  Date of Admission: 02/17/2018 Date of Discharge: 02/19/2018 Date of Delivery: 02/17/2018 Gestational Age at Delivery: [redacted]w[redacted]d  Primary OB: [facility] - Dr. Cherly Hensen  Antepartum complications:  - Hx. Of Postpartum depression  - GERD  Prenatal Labs:  ABO, Rh: --/--/O POS (05/31 0757) Antibody: NEG (05/31 0757) Rubella: Immune (10/31 0000) RPR: Non Reactive (05/31 0757)  HBsAg: Negative (10/31 0000)  HIV: Non-reactive (10/31 0000)  GBS: Negative (05/09 0000)   Admitting Diagnosis: IOL at 39 weeks for term, multiparity   Secondary Diagnoses: Patient Active Problem List   Diagnosis Date Noted  . History of postpartum depression 02/19/2018  . SVD (spontaneous vaginal delivery) 02/18/2018  . Encounter for planned induction of labor 02/17/2018  . Postpartum care following vaginal delivery (5/31) 09/04/2016    Augmentation: Pitocin, AROM Complications: None  Date of Delivery: 02/17/2018 Delivered By: Dr. Cherly Hensen Delivery Type: spontaneous vaginal delivery Anesthesia: epidural Placenta: sponatneous Laceration: none Episiotomy: none  Newborn Data: Live born female  Birth Weight: 6 lb 7.9 oz (2945 g) APGAR: 8, 9  Newborn Delivery   Birth date/time:  02/17/2018 21:29:00 Delivery type:  Vaginal, Spontaneous     Hospital/Postpartum Course  (Vaginal Delivery): Pt. Admitted for IOL at term gestation and hx of multiparity. She progressed well with Pitocin and AROM.  Patient had an uncomplicated postpartum course.  By time of discharge on PPD#2, her pain was controlled on oral pain medications; she had appropriate lochia and was ambulating, voiding without difficulty and tolerating regular diet.  She was deemed stable for discharge to home.     Labs: CBC Latest Ref Rng & Units 02/18/2018 02/17/2018 09/05/2016  WBC 4.0 - 10.5 K/uL 12.0(H) 7.2 12.8(H)  Hemoglobin 12.0 - 15.0 g/dL 11.0(L)  12.0 11.8(L)  Hematocrit 36.0 - 46.0 % 33.2(L) 36.5 34.5(L)  Platelets 150 - 400 K/uL 451(H) 494(H) 382   O POS  Physical exam:  BP 95/69 (BP Location: Left Arm)   Pulse 71   Temp 98.1 F (36.7 C) (Oral)   Resp 18   Ht 5\' 3"  (1.6 m)   Wt 89.1 kg (196 lb 6 oz)   LMP 05/11/2017   SpO2 99%   Breastfeeding? Unknown   BMI 34.79 kg/m  General: alert and no distress Pulm: normal respiratory effort Lochia: appropriate Abdomen: soft, NT Uterine Fundus: firm, below umbilicus Perineum: healing well, no significant erythema, no significant edema Extremities: No evidence of DVT seen on physical exam. No lower extremity edema. Trace pedal edema    Disposition: stable, discharge to home Baby Feeding: formula Baby Disposition: home with mom  Contraception: OCPs  Rh Immune globulin given: N/A Rubella vaccine given: N/A Tdap vaccine given in AP or PP setting: UTD Flu vaccine given in AP or PP setting: not on file   Plan:  [place] Pitney was discharged to home in good condition. Follow-up appointment at [facility] in  2 or 6 weeks.  Discharge Instructions: Per After Visit Summary. Refer to After Visit Summary and [facility] discharge booklet  Activity: Advance as tolerated. Pelvic rest for 6 weeks.   Diet: Regular, Heart Healthy Discharge Medications: Allergies as of 02/19/2018      Reactions   Zantac [ranitidine Hcl] Hives      Medication List    TAKE these medications   ibuprofen 600 MG tablet Commonly known as:  ADVIL,MOTRIN Take 1 tablet (600 mg total) by mouth every 6 (six) hours.  Outpatient follow up:  Follow-up Information    Maxie Betterousins, Sheronette, MD. Schedule an appointment as soon as possible for a visit in 2 week(s).   Specialty:  Obstetrics and Gynecology Why:  Offered 2 week postpartum visit for hx of postpartum depression, pt. will decide if necessary; 6 week PP visit  Contact information: [address] [place]  [id number] [phone]           Signed:  Carlean JewsMeredith Annalynne Ibanez, MSN, CNM [facility]

## 2018-02-19 NOTE — Progress Notes (Signed)
PPD #2, SVD, intact perineum, baby girl "Beth Carey"  S:  Reports feeling well overall, ready to go home today   C/o constipation - reports no bowel movement since Thursday and feeling uncomfortable; has been taking stool softeners while here without relief.  Would like to take something prior to discharge, but declines suppository.             Tolerating po/ No nausea or vomiting / Denies dizziness or SOB             Bleeding is light             Pain controlled with Motrin             Up ad lib / ambulatory / voiding QS  Newborn is formula feeding - mom reports she is planning to pump when she goes home  O:               VS: BP 95/69 (BP Location: Left Arm)   Pulse 71   Temp 98.1 F (36.7 C) (Oral)   Resp 18   Ht 5\' 3"  (1.6 m)   Wt 89.1 kg (196 lb 6 oz)   LMP 05/11/2017   SpO2 99%   Breastfeeding? Unknown   BMI 34.79 kg/m    LABS:              Recent Labs    02/17/18 0757 02/18/18 0530  WBC 7.2 12.0*  HGB 12.0 11.0*  PLT 494* 451*               Blood type: --/--/O POS (05/31 0757)  Rubella: Immune (10/31 0000)                                 Physical Exam:             Alert and oriented X3  Lungs: Clear and unlabored  Heart: regular rate and rhythm / no murmurs  Abdomen: soft, non-tender, non-distended              Fundus: firm, non-tender, U-1  Perineum: intact, minimal edema, no ecchymosis or erythema   Lochia: scant on pad, no clots   Extremities: trace pedal edema, no calf pain or tenderness, TED hose in place    A: PPD # 2, SVD  Hx. Of Postpartum Depression - pt. States she never saw a counselor/therapist or was on medications   Doing well - stable status  P: Routine post partum orders  OTC Miralax PRN for constipation when she goes home; encouraged to increase water intake and fiber in diet   Reviewed s/s of PPD/anxiety - encouraged Family connects nurse visit for evaluation   Discharge home today  WOB discharge instructions, book, and warning s/s reviewed    Offered 2 week PP eval at our office for PPD/anxiety - pt. Will see how she feels this time and reviewed s/s of when to call; otherwise, she will plan on f/u with Dr. Cherly Hensenousins at 6 weeks     Carlean JewsMeredith Sigmon, MSN, CNM [facility]

## 2018-06-13 ENCOUNTER — Encounter (HOSPITAL_COMMUNITY): Payer: Self-pay | Admitting: *Deleted

## 2018-06-13 ENCOUNTER — Other Ambulatory Visit: Payer: Self-pay | Admitting: Obstetrics and Gynecology

## 2018-06-13 ENCOUNTER — Other Ambulatory Visit: Payer: Self-pay

## 2018-07-05 NOTE — Anesthesia Preprocedure Evaluation (Addendum)
Anesthesia Evaluation  Patient identified by MRN, date of birth, ID band Patient awake    Reviewed: Allergy & Precautions, NPO status , Patient's Chart, lab work & pertinent test results  History of Anesthesia Complications Negative for: history of anesthetic complications  Airway Mallampati: II  TM Distance: >3 FB Neck ROM: Full    Dental no notable dental hx. (+) Teeth Intact, Dental Advisory Given   Pulmonary asthma (Childhood asthma, resolved) ,    Pulmonary exam normal breath sounds clear to auscultation       Cardiovascular negative cardio ROS Normal cardiovascular exam Rhythm:Regular Rate:Normal     Neuro/Psych  Headaches, Anxiety Depression    GI/Hepatic negative GI ROS, Neg liver ROS,   Endo/Other  negative endocrine ROS  Renal/GU negative Renal ROS     Musculoskeletal negative musculoskeletal ROS (+)   Abdominal   Peds  Hematology negative hematology ROS (+)   Anesthesia Other Findings Day of surgery medications reviewed with the patient.  Reproductive/Obstetrics                            Anesthesia Physical Anesthesia Plan  ASA: II  Anesthesia Plan: General   Post-op Pain Management:    Induction: Intravenous  PONV Risk Score and Plan: 3 and Ondansetron, Dexamethasone, Treatment may vary due to age or medical condition and Scopolamine patch - Pre-op  Airway Management Planned: Oral ETT  Additional Equipment:   Intra-op Plan:   Post-operative Plan: Extubation in OR  Informed Consent: I have reviewed the patients History and Physical, chart, labs and discussed the procedure including the risks, benefits and alternatives for the proposed anesthesia with the patient or authorized representative who has indicated his/her understanding and acceptance.   Dental advisory given  Plan Discussed with: CRNA and Surgeon  Anesthesia Plan Comments:        Anesthesia  Quick Evaluation

## 2018-07-06 ENCOUNTER — Ambulatory Visit (HOSPITAL_COMMUNITY): Payer: 59 | Admitting: Anesthesiology

## 2018-07-06 ENCOUNTER — Ambulatory Visit (HOSPITAL_COMMUNITY)
Admission: AD | Admit: 2018-07-06 | Discharge: 2018-07-06 | Disposition: A | Discharge status: Home / Self Care | Payer: 59 | Source: Ambulatory Visit | Attending: Obstetrics and Gynecology | Admitting: Obstetrics and Gynecology

## 2018-07-06 ENCOUNTER — Encounter (HOSPITAL_COMMUNITY): Payer: Self-pay

## 2018-07-06 ENCOUNTER — Other Ambulatory Visit: Payer: Self-pay

## 2018-07-06 ENCOUNTER — Encounter (HOSPITAL_COMMUNITY)
Admission: AD | Disposition: A | Discharge status: Home / Self Care | Payer: Self-pay | Source: Ambulatory Visit | Attending: Obstetrics and Gynecology

## 2018-07-06 DIAGNOSIS — Z8709 Personal history of other diseases of the respiratory system: Secondary | ICD-10-CM | POA: Insufficient documentation

## 2018-07-06 DIAGNOSIS — Z302 Encounter for sterilization: Secondary | ICD-10-CM | POA: Insufficient documentation

## 2018-07-06 DIAGNOSIS — Z8249 Family history of ischemic heart disease and other diseases of the circulatory system: Secondary | ICD-10-CM | POA: Insufficient documentation

## 2018-07-06 HISTORY — DX: Headache: R51

## 2018-07-06 HISTORY — DX: Headache, unspecified: R51.9

## 2018-07-06 HISTORY — DX: Anemia, unspecified: D64.9

## 2018-07-06 HISTORY — PX: LAPAROSCOPIC TUBAL LIGATION: SHX1937

## 2018-07-06 HISTORY — DX: Other seasonal allergic rhinitis: J30.2

## 2018-07-06 HISTORY — DX: Anxiety disorder, unspecified: F41.9

## 2018-07-06 LAB — CBC
HCT: 39.4 % (ref 36.0–46.0)
Hemoglobin: 13.1 g/dL (ref 12.0–15.0)
MCH: 31.6 pg (ref 26.0–34.0)
MCHC: 33.2 g/dL (ref 30.0–36.0)
MCV: 94.9 fL (ref 80.0–100.0)
NRBC: 0 % (ref 0.0–0.2)
Platelets: 321 10*3/uL (ref 150–400)
RBC: 4.15 MIL/uL (ref 3.87–5.11)
RDW: 12.9 % (ref 11.5–15.5)
WBC: 4.1 10*3/uL (ref 4.0–10.5)

## 2018-07-06 LAB — PREGNANCY, URINE: PREG TEST UR: NEGATIVE

## 2018-07-06 SURGERY — LIGATION, FALLOPIAN TUBE, LAPAROSCOPIC
Anesthesia: General | Laterality: Bilateral

## 2018-07-06 MED ORDER — FENTANYL CITRATE (PF) 250 MCG/5ML IJ SOLN
INTRAMUSCULAR | Status: AC
  Filled 2018-07-06: qty 5

## 2018-07-06 MED ORDER — KETOROLAC TROMETHAMINE 30 MG/ML IJ SOLN: 30.0000 mg | Freq: Once | INTRAMUSCULAR | Status: DC | PRN

## 2018-07-06 MED ORDER — SCOPOLAMINE 1 MG/3DAYS TD PT72
MEDICATED_PATCH | TRANSDERMAL | Status: AC
  Administered 2018-07-06: 1.5 mg via TRANSDERMAL
  Filled 2018-07-06: qty 1

## 2018-07-06 MED ORDER — LACTATED RINGERS IV SOLN
INTRAVENOUS | Status: DC
  Administered 2018-07-06: 125 mL/h via INTRAVENOUS

## 2018-07-06 MED ORDER — ROCURONIUM BROMIDE 100 MG/10ML IV SOLN
INTRAVENOUS | Status: DC | PRN
  Administered 2018-07-06: 50 mg via INTRAVENOUS

## 2018-07-06 MED ORDER — SCOPOLAMINE 1 MG/3DAYS TD PT72
1.0000 | MEDICATED_PATCH | Freq: Once | TRANSDERMAL | Status: DC
  Administered 2018-07-06: 1.5 mg via TRANSDERMAL

## 2018-07-06 MED ORDER — SODIUM CHLORIDE 0.9 % IJ SOLN
INTRAMUSCULAR | Status: DC | PRN
  Administered 2018-07-06: 10 mL via INTRAVENOUS

## 2018-07-06 MED ORDER — KETOROLAC TROMETHAMINE 30 MG/ML IJ SOLN
INTRAMUSCULAR | Status: AC
  Filled 2018-07-06: qty 1

## 2018-07-06 MED ORDER — MIDAZOLAM HCL 2 MG/2ML IJ SOLN
INTRAMUSCULAR | Status: AC
  Filled 2018-07-06: qty 2

## 2018-07-06 MED ORDER — HYDROMORPHONE HCL 1 MG/ML IJ SOLN
INTRAMUSCULAR | Status: AC
  Filled 2018-07-06: qty 1

## 2018-07-06 MED ORDER — GLYCOPYRROLATE 0.2 MG/ML IJ SOLN
INTRAMUSCULAR | Status: DC | PRN
  Administered 2018-07-06: 0.2 mg via INTRAVENOUS

## 2018-07-06 MED ORDER — IBUPROFEN 800 MG PO TABS: 800.0000 mg | ORAL_TABLET | Freq: Three times a day (TID) | ORAL | 4 refills | Status: AC | PRN

## 2018-07-06 MED ORDER — BUPIVACAINE HCL (PF) 0.25 % IJ SOLN
INTRAMUSCULAR | Status: DC | PRN
  Administered 2018-07-06: 9 mL

## 2018-07-06 MED ORDER — SUGAMMADEX SODIUM 200 MG/2ML IV SOLN
INTRAVENOUS | Status: AC
  Filled 2018-07-06: qty 2

## 2018-07-06 MED ORDER — DEXAMETHASONE SODIUM PHOSPHATE 10 MG/ML IJ SOLN
INTRAMUSCULAR | Status: DC | PRN
  Administered 2018-07-06: 10 mg via INTRAVENOUS

## 2018-07-06 MED ORDER — ONDANSETRON HCL 4 MG/2ML IJ SOLN
INTRAMUSCULAR | Status: DC | PRN
  Administered 2018-07-06: 4 mg via INTRAVENOUS

## 2018-07-06 MED ORDER — LIDOCAINE HCL (CARDIAC) PF 100 MG/5ML IV SOSY
PREFILLED_SYRINGE | INTRAVENOUS | Status: AC
  Filled 2018-07-06: qty 5

## 2018-07-06 MED ORDER — MIDAZOLAM HCL 5 MG/5ML IJ SOLN
INTRAMUSCULAR | Status: DC | PRN
  Administered 2018-07-06: 2 mg via INTRAVENOUS

## 2018-07-06 MED ORDER — SUGAMMADEX SODIUM 200 MG/2ML IV SOLN
INTRAVENOUS | Status: DC | PRN
  Administered 2018-07-06: 150 mg via INTRAVENOUS

## 2018-07-06 MED ORDER — DEXAMETHASONE SODIUM PHOSPHATE 4 MG/ML IJ SOLN
INTRAMUSCULAR | Status: AC
  Filled 2018-07-06: qty 1

## 2018-07-06 MED ORDER — ONDANSETRON HCL 4 MG/2ML IJ SOLN
INTRAMUSCULAR | Status: AC
  Filled 2018-07-06: qty 2

## 2018-07-06 MED ORDER — LIDOCAINE HCL (PF) 1 % IJ SOLN
INTRAMUSCULAR | Status: AC
  Filled 2018-07-06: qty 5

## 2018-07-06 MED ORDER — GLYCOPYRROLATE 0.2 MG/ML IJ SOLN
INTRAMUSCULAR | Status: AC
  Filled 2018-07-06: qty 1

## 2018-07-06 MED ORDER — 0.9 % SODIUM CHLORIDE (POUR BTL) OPTIME
TOPICAL | Status: DC | PRN
  Administered 2018-07-06: 1000 mL

## 2018-07-06 MED ORDER — PROMETHAZINE HCL 25 MG/ML IJ SOLN
6.2500 mg | INTRAMUSCULAR | Status: DC | PRN
  Administered 2018-07-06: 6.25 mg via INTRAVENOUS

## 2018-07-06 MED ORDER — KETOROLAC TROMETHAMINE 30 MG/ML IJ SOLN
INTRAMUSCULAR | Status: DC | PRN
  Administered 2018-07-06: 30 mg via INTRAVENOUS

## 2018-07-06 MED ORDER — PROPOFOL 10 MG/ML IV BOLUS
INTRAVENOUS | Status: AC
  Filled 2018-07-06: qty 20

## 2018-07-06 MED ORDER — FENTANYL CITRATE (PF) 100 MCG/2ML IJ SOLN: 25.0000 ug | INTRAMUSCULAR | Status: DC | PRN

## 2018-07-06 MED ORDER — FENTANYL CITRATE (PF) 100 MCG/2ML IJ SOLN
INTRAMUSCULAR | Status: DC | PRN
  Administered 2018-07-06 (×2): 100 ug via INTRAVENOUS
  Administered 2018-07-06: 50 ug via INTRAVENOUS

## 2018-07-06 MED ORDER — HYDROMORPHONE HCL 1 MG/ML IJ SOLN
INTRAMUSCULAR | Status: DC | PRN
  Administered 2018-07-06 (×2): 0.5 mg via INTRAVENOUS

## 2018-07-06 MED ORDER — LIDOCAINE HCL (CARDIAC) PF 100 MG/5ML IV SOSY
PREFILLED_SYRINGE | INTRAVENOUS | Status: DC | PRN
  Administered 2018-07-06: 100 mg via INTRAVENOUS

## 2018-07-06 MED ORDER — SUGAMMADEX SODIUM 500 MG/5ML IV SOLN
INTRAVENOUS | Status: AC
  Filled 2018-07-06: qty 5

## 2018-07-06 MED ORDER — PROMETHAZINE HCL 25 MG/ML IJ SOLN
INTRAMUSCULAR | Status: AC
  Filled 2018-07-06: qty 1

## 2018-07-06 MED ORDER — PROPOFOL 10 MG/ML IV BOLUS
INTRAVENOUS | Status: DC | PRN
  Administered 2018-07-06: 200 mg via INTRAVENOUS

## 2018-07-06 SURGICAL SUPPLY — 20 items
CATH ROBINSON RED A/P 16FR (CATHETERS) ×3 IMPLANT
DRSG OPSITE POSTOP 3X4 (GAUZE/BANDAGES/DRESSINGS) ×3 IMPLANT
DURAPREP 26ML APPLICATOR (WOUND CARE) ×3 IMPLANT
GLOVE BIOGEL PI IND STRL 7.0 (GLOVE) ×2 IMPLANT
GLOVE BIOGEL PI INDICATOR 7.0 (GLOVE) ×4
GLOVE ECLIPSE 6.5 STRL STRAW (GLOVE) ×3 IMPLANT
GOWN STRL REUS W/TWL LRG LVL3 (GOWN DISPOSABLE) ×6 IMPLANT
NEEDLE INSUFFLATION 120MM (ENDOMECHANICALS) ×3 IMPLANT
PACK LAPAROSCOPY BASIN (CUSTOM PROCEDURE TRAY) ×3 IMPLANT
PACK TRENDGUARD 450 HYBRID PRO (MISCELLANEOUS) IMPLANT
PROTECTOR NERVE ULNAR (MISCELLANEOUS) ×6 IMPLANT
SLEEVE XCEL OPT CAN 5 100 (ENDOMECHANICALS) ×3 IMPLANT
SUT VICRYL 0 UR6 27IN ABS (SUTURE) ×3 IMPLANT
SUT VICRYL 4-0 PS2 18IN ABS (SUTURE) ×3 IMPLANT
TOWEL OR 17X24 6PK STRL BLUE (TOWEL DISPOSABLE) ×6 IMPLANT
TRENDGUARD 450 HYBRID PRO PACK (MISCELLANEOUS)
TROCAR OPTI TIP 5M 100M (ENDOMECHANICALS) ×3 IMPLANT
TROCAR XCEL DIL TIP R 11M (ENDOMECHANICALS) ×3 IMPLANT
TUBING INSUF HEATED (TUBING) ×3 IMPLANT
WARMER LAPAROSCOPE (MISCELLANEOUS) ×3 IMPLANT

## 2018-07-06 NOTE — Discharge Instructions (Addendum)
Warm heat to abdomen every 4 hours for 24 hrs. Nothing per vagina for one week. Finish your pack of birth control pills.  DISCHARGE INSTRUCTIONS: Laparoscopy  The following instructions have been prepared to help you care for yourself upon your return home today.  Wound care:  Do not get the incision wet for the first 24 hours. The incision should be kept clean and dry.  The Band-Aids or dressings may be removed the day after surgery.  Should the incision become sore, red, and swollen after the first week, check with your doctor.  Personal hygiene:  Shower the day after your procedure.  Activity and limitations:  Do NOT lift anything more than 15 pounds for 2-3 weeks after surgery.  Do NOT rest in bed all day.  Walking is encouraged. Walk each day, starting slowly with 5-minute walks 3 or 4 times a day. Slowly increase the length of your walks.  Walk up and down stairs slowly.  Do NOT do strenuous activities, such as golfing, playing tennis, bowling, running, biking, weight lifting, gardening, mowing, or vacuuming for 2-4 weeks. Ask your doctor when it is okay to start.  Diet: Eat a light meal as desired this evening. You may resume your usual diet tomorrow.  Return to work: This is dependent on the type of work you do. For the most part you can return to a desk job within a week of surgery. If you are more active at work, please discuss this with your doctor.  What to expect after your surgery: You may have a slight burning sensation when you urinate on the first day. You may have a very small amount of blood in the urine. Expect to have a small amount of vaginal discharge/light bleeding for 1-2 weeks. It is not unusual to have abdominal soreness and bruising for up to 2 weeks. You may be tired and need more rest for about 1 week. You may experience shoulder pain for 24-72 hours. Lying flat in bed may relieve it.  Call your doctor for any of the following:  Develop a fever of  100.4 or greater  Inability to urinate 6 hours after discharge from hospital  Severe pain not relieved by pain medications  Persistent of heavy bleeding at incision site  Redness or swelling around incision site after a week  Increasing nausea or vomiting  Post Anesthesia Home Care Instructions  NO IBUPROFEN PRODUCTS UNTIL: 5:00 PM  Activity: Get plenty of rest for the remainder of the day. A responsible individual must stay with you for 24 hours following the procedure.  For the next 24 hours, DO NOT: -Drive a car -Advertising copywriter -Drink alcoholic beverages -Take any medication unless instructed by your physician -Make any legal decisions or sign important papers.  Meals: Start with liquid foods such as gelatin or soup. Progress to regular foods as tolerated. Avoid greasy, spicy, heavy foods. If nausea and/or vomiting occur, drink only clear liquids until the nausea and/or vomiting subsides. Call your physician if vomiting continues.  Special Instructions/Symptoms: Your throat may feel dry or sore from the anesthesia or the breathing tube placed in your throat during surgery. If this causes discomfort, gargle with warm salt water. The discomfort should disappear within 24 hours.  If you had a scopolamine patch placed behind your ear for the management of post- operative nausea and/or vomiting:  1. The medication in the patch is effective for 72 hours, after which it should be removed.  Wrap patch in a tissue  and discard in the trash. Wash hands thoroughly with soap and water. 2. You may remove the patch earlier than 72 hours if you experience unpleasant side effects which may include dry mouth, dizziness or visual disturbances. 3. Avoid touching the patch. Wash your hands with soap and water after contact with the patch.

## 2018-07-06 NOTE — Anesthesia Postprocedure Evaluation (Signed)
Anesthesia Post Note  Patient: [place] Detter  Procedure(s) Performed: LAPAROSCOPIC TUBAL LIGATION With Bipolar Cautery (Bilateral )     Patient location during evaluation: PACU Anesthesia Type: General Level of consciousness: awake and alert Pain management: pain level controlled Vital Signs Assessment: post-procedure vital signs reviewed and stable Respiratory status: spontaneous breathing, nonlabored ventilation and respiratory function stable Cardiovascular status: blood pressure returned to baseline and stable Postop Assessment: no apparent nausea or vomiting Anesthetic complications: no    Last Vitals:  Vitals:   07/06/18 1245 07/06/18 1257  BP: 95/67   Pulse: 63 68  Resp: 10 15  Temp:    SpO2: 100% 98%    Last Pain:  Vitals:   07/06/18 1257  TempSrc:   PainSc: 0-No pain   Pain Goal: Patients Stated Pain Goal: 5 (07/06/18 1257)               Kaylyn Layer

## 2018-07-06 NOTE — Transfer of Care (Signed)
Immediate Anesthesia Transfer of Care Note  Patient: Beth Carey  Procedure(s) Performed: LAPAROSCOPIC TUBAL LIGATION With Bipolar Cautery (Bilateral )  Patient Location: PACU  Anesthesia Type:General  Level of Consciousness: awake, alert  and oriented  Airway & Oxygen Therapy: Patient Spontanous Breathing and Patient connected to nasal cannula oxygen  Post-op Assessment: Report given to RN and Post -op Vital signs reviewed and stable  Post vital signs: Reviewed and stable  Last Vitals:  Vitals Value Taken Time  BP 115/70 07/06/2018 11:55 AM  Temp    Pulse 99 07/06/2018 11:56 AM  Resp 14 07/06/2018 11:56 AM  SpO2 100 % 07/06/2018 11:56 AM  Vitals shown include unvalidated device data.  Last Pain:  Vitals:   07/06/18 0909  TempSrc: Oral  PainSc: 0-No pain      Patients Stated Pain Goal: 5 (07/06/18 0909)  Complications: No apparent anesthesia complications

## 2018-07-06 NOTE — H&P (Signed)
Beth Carey is an 27 y.o. female G6P3 BF presents for laparoscopic tubal ligation. Pt desires permanent sterilization  Pertinent Gynecological History: Menses: regular Bleeding: nl Contraception: OCP (estrogen/progesterone) DES exposure: denies Blood transfusions: none Sexually transmitted diseases: no past history Previous GYN Procedures: DNC  Last mammogram: n/a Date: n/a Last pap: normal Date: 08/05/17 OB History: G6, P 3   Menstrual History: Menarche age: n/a Patient's last menstrual period was 05/11/2018.    Past Medical History:  Diagnosis Date  . Anxiety   . Asthma    as  child, no prob as adult, no inhaler  . Depression   . Headache   . Medical history non-contributory   . Seasonal allergies   . SVD (spontaneous vaginal delivery)    x 3  . Vaginal Pap smear, abnormal    f/u was normal    Past Surgical History:  Procedure Laterality Date  . COLPOSCOPY    . THERAPEUTIC ABORTION      Family History  Problem Relation Age of Onset  . Heart disease Mother   . Heart disease Maternal Grandmother   . Hyperlipidemia Maternal Grandmother   . Hypertension Maternal Grandmother   . Heart disease Maternal Grandfather   . Mental illness Paternal Grandmother   . Heart disease Paternal Grandmother     Social History:  reports that she has never smoked. She has never used smokeless tobacco. She reports that she drank alcohol. She reports that she does not use drugs.  Allergies:  Allergies  Allergen Reactions  . Zantac [Ranitidine Hcl] Hives    No medications prior to admission.    Review of Systems  All other systems reviewed and are negative.   Height 5\' 3"  (1.6 m), weight 73 kg, last menstrual period 05/11/2018, not currently breastfeeding. Physical Exam  Constitutional: She is oriented to person, place, and time. She appears well-developed and well-nourished.  HENT:  Head: Atraumatic.  Eyes: EOM are normal.  Neck: Neck supple.  Cardiovascular:  Regular rhythm.  Respiratory: Breath sounds normal.  GI: Soft.  Genitourinary:  Genitourinary Comments: Vulva nl Vagina nl Cervix parous Uterus AV Adnexa nl  Neurological: She is alert and oriented to person, place, and time. She has normal reflexes.  Skin: Skin is warm and dry.    No results found for this or any previous visit (from the past 24 hour(s)).  No results found.  Assessment/Plan: Desires sterilization P) LTL with bipolar cautery. Risk of surgery reviewed includes infection, bleeding, nonreversible, failure rate 1/500-1/600, permanent, injury to underlying/surrounding organ structures, thermal injury. All ? answered  Beth Carey A Onofre Gains 07/06/2018, 6:06 AM

## 2018-07-06 NOTE — Brief Op Note (Signed)
07/06/2018  11:51 AM  PATIENT:  [place] Groome  27 y.o. female  PRE-OPERATIVE DIAGNOSIS:  Desires Sterilization  POST-OPERATIVE DIAGNOSIS:  Desires Sterilization  PROCEDURE:  Laparoscopic tubal ligation with bipolar cautery  SURGEON:  Surgeon(s) and Role:    * Maxie Better, MD - Primary  PHYSICIAN ASSISTANT:   ASSISTANTS: none   ANESTHESIA:   general FINDINGS; NL Tubes and ovaries. Nl uterus, nl liver edge. No endometriosis noted EBL:  5 mL   BLOOD ADMINISTERED:none  DRAINS: none   LOCAL MEDICATIONS USED:  MARCAINE     SPECIMEN:  No Specimen  DISPOSITION OF SPECIMEN:  N/A  COUNTS:  YES  TOURNIQUET:  * No tourniquets in log *  DICTATION: .Other Dictation: Dictation Number [id number]  PLAN OF CARE: Discharge to home after PACU  PATIENT DISPOSITION:  PACU - hemodynamically stable.   Delay start of Pharmacological VTE agent (>24hrs) due to surgical blood loss or risk of bleeding: no

## 2018-07-06 NOTE — Anesthesia Procedure Notes (Signed)
Procedure Name: Intubation Date/Time: 07/06/2018 10:49 AM Performed by: Junious Silk, CRNA Pre-anesthesia Checklist: Patient identified, Emergency Drugs available, Suction available, Patient being monitored and Timeout performed Patient Re-evaluated:Patient Re-evaluated prior to induction Oxygen Delivery Method: Circle system utilized Preoxygenation: Pre-oxygenation with 100% oxygen Induction Type: IV induction Ventilation: Mask ventilation without difficulty Laryngoscope Size: Miller and 2 Grade View: Grade I Tube type: Oral Tube size: 7.0 mm Number of attempts: 1 Airway Equipment and Method: Stylet Placement Confirmation: ETT inserted through vocal cords under direct vision,  positive ETCO2,  CO2 detector and breath sounds checked- equal and bilateral Secured at: 24 cm Tube secured with: Tape Dental Injury: Teeth and Oropharynx as per pre-operative assessment

## 2018-07-07 ENCOUNTER — Encounter (HOSPITAL_COMMUNITY): Payer: Self-pay | Admitting: Obstetrics and Gynecology

## 2018-07-07 NOTE — Op Note (Signed)
NAMEJAKEIRA, SEEMAN MEDICAL RECORD [id number] ACCOUNT [id number] DATE OF BIRTH:1991/01/01 FACILITY: [facility] LOCATION: [facility] PHYSICIAN:Keiona Jenison A. Amber Williard, MD  OPERATIVE REPORT  DATE OF PROCEDURE:  07/06/2018  PREOPERATIVE DIAGNOSIS:  Desires sterilization.  POSTOPERATIVE DIAGNOSIS:  Desires sterilization.  PROCEDURE:  Laparoscopic tubal ligation with bipolar cautery.  ANESTHESIA:  General.  SURGEON:  Maxie Better, MD  ASSISTANT:  None.  DESCRIPTION OF PROCEDURE:  Under adequate general anesthesia, the patient was placed in the dorsal lithotomy position.  She was sterilely prepped and draped in the usual fashion.  The bladder was catheterized for a small amount of urine.  Examination  under anesthesia revealed an anteverted uterus and no adnexal masses could be appreciated.  Bivalve speculum was placed in the vagina.  Single tooth tenaculum was placed on the anterior lip of the cervix.  An acorn cannula was introduced into the cervical os and attached to the tenaculum for manipulation of the uterus.  Bivalve speculum was then removed.  Attention was then turned to the abdomen.  Marcaine 0.25% was injected in the umbilical area.  A vertical incision was made.  Veress needle was  introduced, tested with normal saline; 2.5 liters of CO2 was subsequently insufflated.  Veress needle was then removed.  A 10 mm disposable trocar with sleeve was introduced into the abdomen without incident.  A lighted video laparoscope was inserted. The patient was placed in Trendelenburg position. Panoramic inspection was notable for normal liver edge.  Suprapubic incision was then made.  A 5 mm port was placed under direct visualization.  A probe was then utilized to assess the pelvis.  No  endometriosis was noted in the anterior and posterior cul-de-sac.  Both tubes were noted to be normal.  Both ovaries were normal.  Uterus was unremarkable.  Using a bipolar cautery, the mid portion of both  fallopian tubes were serially cauterized.  When this was felt to be adequate on both sides, the procedure was completed by removing the suprapubic port, deflating the abdomen and removing the infraumbilical port site under direct visualization.  The incisions were then closed with 4-0 Vicryl subcuticular closure.  The instruments from the vagina were removed.  SPECIMEN:  None.  ESTIMATED BLOOD LOSS:  Minimal.  COMPLICATIONS:  None.  The patient tolerated the procedure well and was transferred to recovery room in stable condition.  AN/NUANCE  D:07/06/2018 T:07/07/2018 JOB:[id number]

## 2020-02-20 ENCOUNTER — Emergency Department
Admission: EM | Admit: 2020-02-20 | Discharge: 2020-02-20 | Disposition: A | Discharge status: Home / Self Care | Payer: Medicaid Other | Source: Home / Self Care | Attending: Family Medicine | Admitting: Family Medicine

## 2020-02-20 ENCOUNTER — Encounter: Payer: Self-pay | Admitting: Emergency Medicine

## 2020-02-20 ENCOUNTER — Other Ambulatory Visit: Payer: Self-pay

## 2020-02-20 DIAGNOSIS — R05 Cough: Secondary | ICD-10-CM

## 2020-02-20 DIAGNOSIS — J209 Acute bronchitis, unspecified: Secondary | ICD-10-CM

## 2020-02-20 DIAGNOSIS — R059 Cough, unspecified: Secondary | ICD-10-CM

## 2020-02-20 DIAGNOSIS — Z20822 Contact with and (suspected) exposure to covid-19: Secondary | ICD-10-CM

## 2020-02-20 MED ORDER — FLUTICASONE-SALMETEROL 100-50 MCG/DOSE IN AEPB: 1.0000 | INHALATION_SPRAY | Freq: Two times a day (BID) | RESPIRATORY_TRACT | 0 refills | Status: AC

## 2020-02-20 NOTE — Discharge Instructions (Addendum)
Finish Augmentin and prednisone.  Begin Advair inhaler. Take plain guaifenesin (1200mg  extended release tabs such as Mucinex) twice daily, with plenty of water, for cough and congestion.  May add Pseudoephedrine (30mg , one or two every 4 to 6 hours) for sinus congestion.  Get adequate rest.   May use Afrin nasal spray (or generic oxymetazoline) each morning for about 5 days and then discontinue.  Also recommend using saline nasal spray several times daily and saline nasal irrigation (AYR is a common brand).  Use Flonase nasal spray each morning after using Afrin nasal spray and saline nasal irrigation. Try warm salt water gargles for sore throat.  Stop all antihistamines for now, and other non-prescription cough/cold preparations. May take Delsym Cough Suppressant at bedtime for nighttime cough.   Isolate yourself until COVID-19 test result is available.   If your COVID19 test is positive, then you are infected with the novel coronavirus and could give the virus to others.  Please continue isolation at home for at least 10 days since the start of your symptoms.  Once you complete your 10 day quarantine, you may return to normal activities as long as you've not had a fever for over 24 hours (without taking fever reducing medicine) and your symptoms are improving. Please continue good preventive care measures, including:  frequent hand-washing, avoid touching your face, cover coughs/sneezes, stay out of crowds and keep a 6 foot distance from others.  Go to the nearest hospital emergency room if fever/cough/breathlessness are severe or illness seems like a threat to life.

## 2020-02-20 NOTE — ED Triage Notes (Signed)
Pt seen at [facility] pta (9:06am) - dx w/ bronchitis ( pt thought it was pneumonia) CXR done in ER per pt Pt has had a cough x 1 month Pt did not have a COVID test done in ER- requesting one now Pt had a shot in ER ( unknown per pt) Scripts for augmentin & prednisone sent in by ER No COVID vaccine

## 2020-02-20 NOTE — ED Provider Notes (Signed)
[facility]    CSN: [id number] Arrival date & time: 02/20/20  1022      History   Chief Complaint Chief Complaint  Patient presents with  . Covid Exposure    HPI Beth Carey is a 29 y.o. female.   Patient presents for COVID19 testing.  She states that she had a URI about a month ago followed by a persistent cough.  During the past 3 to 4 days she has had increased nasal congestion, cough, and sneezing.  She has a past history of asthma. She was seen in the [facility] ER this morning where she she was diagnosed with bronchitis.  She received one gram of Rocephin IM, and prescribed Augmentin and prednisone.  The history is provided by the patient.    Past Medical History:  Diagnosis Date  . Anemia    during pregnancy  . Anxiety   . Asthma    as  child, no prob as adult, no inhaler  . Depression   . Headache   . Medical history non-contributory   . Seasonal allergies   . SVD (spontaneous vaginal delivery)    x 3  . Vaginal Pap smear, abnormal    f/u was normal    Patient Active Problem List   Diagnosis Date Noted  . History of postpartum depression 02/19/2018  . SVD (spontaneous vaginal delivery) 02/18/2018  . Encounter for planned induction of labor 02/17/2018  . Postpartum care following vaginal delivery (5/31) 09/04/2016    Past Surgical History:  Procedure Laterality Date  . COLPOSCOPY    . LAPAROSCOPIC TUBAL LIGATION Bilateral 07/06/2018   Procedure: LAPAROSCOPIC TUBAL LIGATION With Bipolar Cautery;  Surgeon: Servando Salina, MD;  Location: [facility];  Service: Gynecology;  Laterality: Bilateral;  . THERAPEUTIC ABORTION      OB History    Gravida  6   Para  3   Term  3   Preterm      AB  3   Living  3     SAB  1   TAB  2   Ectopic      Multiple  0   Live Births  3            Home Medications    Prior to Admission medications   Medication Sig Start Date End Date Taking? Authorizing Provider    amoxicillin-clavulanate (AUGMENTIN) 500-125 MG tablet Take 1 tablet by mouth 3 (three) times daily. 02/20/20 02/26/20 Yes [provider]  predniSONE (DELTASONE) 10 MG tablet Take 10 mg by mouth daily with breakfast. 4 tabs x 2days, 3 tabs x 2 days , 2 tabs x 2 days, 1 tab x 2 days   Yes [provider]  Fluticasone-Salmeterol (ADVAIR DISKUS) 100-50 MCG/DOSE AEPB Inhale 1 puff into the lungs 2 (two) times daily. 02/20/20 02/19/21  Kandra Nicolas, MD  hydrocortisone cream 1 % Apply 1 application topically 3 (three) times daily.    [provider]  ibuprofen (ADVIL,MOTRIN) 800 MG tablet Take 1 tablet (800 mg total) by mouth every 8 (eight) hours as needed. 07/06/18   Servando Salina, MD    Family History Family History  Problem Relation Age of Onset  . Heart disease Mother   . Heart disease Maternal Grandmother   . Hyperlipidemia Maternal Grandmother   . Hypertension Maternal Grandmother   . Heart disease Maternal Grandfather   . Mental illness Paternal Grandmother   . Heart disease Paternal Grandmother     Social History  Social History   Tobacco Use  . Smoking status: Current Every Day Smoker    Years: 2.00  . Smokeless tobacco: Never Used  Substance Use Topics  . Alcohol use: Not Currently  . Drug use: Yes    Frequency: 7.0 times per week    Types: Marijuana    Comment: daily use/ last use 07/05/2018 2100     Allergies   Zantac [ranitidine hcl]   Review of Systems Review of Systems No sore throat + cough No pleuritic pain No wheezing + nasal congestion + post-nasal drainage No sinus pain/pressure No itchy/red eyes No earache No hemoptysis No SOB No fever/chills No nausea No vomiting No abdominal pain No diarrhea No urinary symptoms No skin rash + fatigue No myalgias + headache Used OTC meds without relief   Physical Exam Triage Vital Signs ED Triage Vitals  Enc Vitals Group     BP 02/20/20 1040 128/87     Pulse Rate  02/20/20 1040 87     Resp 02/20/20 1040 18     Temp 02/20/20 1040 98.1 F (36.7 C)     Temp Source 02/20/20 1040 Oral     SpO2 02/20/20 1040 97 %     Weight --      Height --      Head Circumference --      Peak Flow --      Pain Score 02/20/20 1140 0     Pain Loc --      Pain Edu? --      Excl. in GC? --    No data found.  Updated Vital Signs BP 128/87 (BP Location: Right Arm)   Pulse 87   Temp 98.1 F (36.7 C) (Oral)   Resp 18   LMP 02/01/2020 (Exact Date)   SpO2 97%   Visual Acuity Right Eye Distance:   Left Eye Distance:   Bilateral Distance:    Right Eye Near:   Left Eye Near:    Bilateral Near:     Physical Exam Nursing notes and Vital Signs reviewed. Appearance:  Patient appears stated age, and in no acute distress Eyes:  Pupils are equal, round, and reactive to light and accomodation.  Extraocular movement is intact.  Conjunctivae are not inflamed  Ears:  Canals normal.  Tympanic membranes normal.  Nose:  Mildly congested turbinates.  No sinus tenderness.   Pharynx:  Normal Neck:  Supple.  Mildly enlarged lateral nodes are present, tender to palpation on the left.   Lungs:  Clear to auscultation.  Breath sounds are equal.  Moving air well. Heart:  Regular rate and rhythm without murmurs, rubs, or gallops.  Abdomen:  Nontender without masses or hepatosplenomegaly.  Bowel sounds are present.  No CVA or flank tenderness.  Extremities:  No edema.  Skin:  No rash present.   UC Treatments / Results  Labs (all labs ordered are listed, but only abnormal results are displayed) Labs Reviewed  NOVEL CORONAVIRUS, NAA    EKG   Radiology No results found.  Procedures Procedures (including critical care time)  Medications Ordered in UC Medications - No data to display  Initial Impression / Assessment and Plan / UC Course  I have reviewed the triage vital signs and the nursing notes.  Pertinent labs & imaging results that were available during my care of  the patient were reviewed by me and considered in my medical decision making (see chart for details).    COVID19 send out. Add Advair Discus.  Followup with Family Doctor if not improved in one week.    Final Clinical Impressions(s) / UC Diagnoses   Final diagnoses:  Cough  Acute bronchitis, unspecified organism  Encounter for laboratory testing for COVID-19 virus     Discharge Instructions     Finish Augmentin and prednisone.  Begin Advair inhaler. Take plain guaifenesin (1200mg  extended release tabs such as Mucinex) twice daily, with plenty of water, for cough and congestion.  May add Pseudoephedrine (30mg , one or two every 4 to 6 hours) for sinus congestion.  Get adequate rest.   May use Afrin nasal spray (or generic oxymetazoline) each morning for about 5 days and then discontinue.  Also recommend using saline nasal spray several times daily and saline nasal irrigation (AYR is a common brand).  Use Flonase nasal spray each morning after using Afrin nasal spray and saline nasal irrigation. Try warm salt water gargles for sore throat.  Stop all antihistamines for now, and other non-prescription cough/cold preparations. May take Delsym Cough Suppressant at bedtime for nighttime cough.   Isolate yourself until COVID-19 test result is available.   If your COVID19 test is positive, then you are infected with the novel coronavirus and could give the virus to others.  Please continue isolation at home for at least 10 days since the start of your symptoms.  Once you complete your 10 day quarantine, you may return to normal activities as long as you've not had a fever for over 24 hours (without taking fever reducing medicine) and your symptoms are improving. Please continue good preventive care measures, including:  frequent hand-washing, avoid touching your face, cover coughs/sneezes, stay out of crowds and keep a 6 foot distance from others.  Go to the nearest hospital emergency room if  fever/cough/breathlessness are severe or illness seems like a threat to life.     ED Prescriptions    Medication Sig Dispense Auth. Provider   Fluticasone-Salmeterol (ADVAIR DISKUS) 100-50 MCG/DOSE AEPB Inhale 1 puff into the lungs 2 (two) times daily. 1 each , MD        , MD 02/22/20 [phone]

## 2020-02-21 LAB — NOVEL CORONAVIRUS, NAA: SARS-CoV-2, NAA: NOT DETECTED

## 2020-02-21 LAB — SARS-COV-2, NAA 2 DAY TAT
